# Patient Record
Sex: Male | Born: 1945 | Race: White | Hispanic: No | Marital: Married | Smoking: Never smoker
Health system: Southern US, Community
[De-identification: ages and names within clinical notes are randomized; demographics above are authoritative.]

## PROBLEM LIST (undated history)

## (undated) DIAGNOSIS — C61 Malignant neoplasm of prostate: Secondary | ICD-10-CM

## (undated) DIAGNOSIS — E785 Hyperlipidemia, unspecified: Secondary | ICD-10-CM

## (undated) DIAGNOSIS — I4891 Unspecified atrial fibrillation: Secondary | ICD-10-CM

## (undated) HISTORY — PX: OTHER SURGICAL HISTORY: SHX169

## (undated) HISTORY — DX: Unspecified atrial fibrillation: I48.91

## (undated) HISTORY — PX: CYST EXCISION: SHX5701

## (undated) HISTORY — PX: TONSILECTOMY, ADENOIDECTOMY, BILATERAL MYRINGOTOMY AND TUBES: SHX2538

---

## 1997-10-30 ENCOUNTER — Emergency Department (HOSPITAL_COMMUNITY): Admission: EM | Admit: 1997-10-30 | Discharge: 1997-10-30 | Payer: Self-pay | Admitting: Emergency Medicine

## 2002-05-02 ENCOUNTER — Encounter: Payer: Self-pay | Admitting: Internal Medicine

## 2008-04-28 ENCOUNTER — Encounter: Admission: RE | Admit: 2008-04-28 | Discharge: 2008-04-28 | Payer: Self-pay | Admitting: Cardiology

## 2008-04-28 ENCOUNTER — Encounter: Payer: Self-pay | Admitting: Cardiology

## 2008-04-30 ENCOUNTER — Encounter: Admission: RE | Admit: 2008-04-30 | Discharge: 2008-04-30 | Payer: Self-pay | Admitting: Cardiology

## 2008-05-06 ENCOUNTER — Encounter (INDEPENDENT_AMBULATORY_CARE_PROVIDER_SITE_OTHER): Payer: Self-pay | Admitting: Cardiology

## 2008-05-06 ENCOUNTER — Ambulatory Visit (HOSPITAL_COMMUNITY): Admission: RE | Admit: 2008-05-06 | Discharge: 2008-05-06 | Payer: Self-pay | Admitting: Cardiology

## 2009-08-30 ENCOUNTER — Encounter (INDEPENDENT_AMBULATORY_CARE_PROVIDER_SITE_OTHER): Payer: Self-pay | Admitting: *Deleted

## 2009-09-09 ENCOUNTER — Encounter (INDEPENDENT_AMBULATORY_CARE_PROVIDER_SITE_OTHER): Payer: Self-pay | Admitting: *Deleted

## 2009-09-13 ENCOUNTER — Ambulatory Visit: Payer: Self-pay | Admitting: Internal Medicine

## 2009-10-05 ENCOUNTER — Ambulatory Visit: Payer: Self-pay | Admitting: Internal Medicine

## 2009-10-05 ENCOUNTER — Ambulatory Visit (HOSPITAL_COMMUNITY): Admission: RE | Admit: 2009-10-05 | Discharge: 2009-10-05 | Payer: Self-pay | Admitting: Internal Medicine

## 2009-10-07 ENCOUNTER — Encounter: Payer: Self-pay | Admitting: Internal Medicine

## 2010-06-30 NOTE — Procedures (Signed)
Summary: Colonoscopy  Patient: [name] Note: All result statuses are Final unless otherwise noted.  Tests: (1) Colonoscopy (COL)   COL Colonoscopy           DONE     [facility]     [address] [place], [place]  [address]           COLONOSCOPY PROCEDURE REPORT           PATIENT:  Stephen Francis, Stephen Francis  MR#:  [id number]     BIRTHDATE:  1946/03/06, 63 yrs. old  GENDER:  male     ENDOSCOPIST:  Iva Boop, MD, [facility]           PROCEDURE DATE:  10/05/2009     PROCEDURE:  Colonoscopy with snare polypectomy     ASA CLASS:  Class III     INDICATIONS:  surveillance and high-risk screening, history of     polyps Two polyps removed 2003.     MEDICATIONS:   Fentanyl 100 mcg IV, Versed 7.5 mg IV           DESCRIPTION OF PROCEDURE:   After the risks benefits and     alternatives of the procedure were thoroughly explained, informed     consent was obtained.  Digital rectal exam was performed and     revealed no abnormalities and normal prostate.   The  endoscope     was introduced through the anus and advanced to the cecum, which     was identified by both the appendix and ileocecal valve, without     limitations.  The quality of the prep was good, using MoviPrep.     The instrument was then slowly withdrawn as the colon was fully     examined. Insertion: 6 mins withdrawal: 12 mins     <<PROCEDUREIMAGES>>           FINDINGS:  A sessile polyp was found in the proximal transverse     colon. It was 12 mm in size. Polyp was snared, then cauterized     with monopolar cautery. Retrieval was successful. Removed in two     pieces. Difficult snare due to location on edge of mobile fold.     Moderate diverticulosis was found in the left colon.  This was     otherwise a normal examination of the colon.   Retroflexed views     in the rectum revealed no abnormalities.    The scope was then     withdrawn from the patient and the procedure completed.           COMPLICATIONS:  None     ENDOSCOPIC IMPRESSION:     1) 12 mm sessile polyp in the proximal transverse colon -     removed     2) Moderate diverticulosis in the left colon     3) Otherwise normal examination, good prep     4) Personal history of colon polyp removal 2003 - pathology not     available now but will review     RECOMMENDATIONS:     1) No aspirin or NSAID's for 2 weeks     REPEAT EXAM:  In for Colonoscopy, pending biopsy results.           Iva Boop, MD, [facility]           CC:  Lesle Chris, MD and The Patient  n.     eSIGNED:   Iva Boop at 10/05/2009 09:31 AM           [name], [id number]  Note: An exclamation mark (!) indicates a result that was not dispersed into the flowsheet. Document Creation Date: 10/05/2009 9:32 AM _______________________________________________________________________  (1) Order result status: Final Collection or observation date-time: 10/05/2009 09:24 Requested date-time:  Receipt date-time:  Reported date-time:  Referring Physician:   Ordering Physician: Stan Head [phone]) Specimen Source:  Source: Launa Grill Order Number: [id number] Lab site:

## 2010-06-30 NOTE — Procedures (Signed)
Summary: Colonoscopy   Colonoscopy  Procedure date:  05/02/2002  Findings:      Location:  [facility].   Patient Name: Stephen Francis, Stephen Francis MRN:  Procedure Procedures: Colonoscopy CPT: 91478.  Personnel: Endoscopist: Iva Boop, MD, [facility].  Exam Location: Exam performed in Outpatient Clinic. Outpatient  Patient Consent: Procedure, Alternatives, Risks and Benefits discussed, consent obtained, from patient. Consent was obtained by the RN.  Indications  Average Risk Screening Routine.  History  Pre-Exam Physical: Performed May 02, 2002. Cardio-pulmonary exam, Rectal exam, HEENT exam , Mental status exam WNL.  Exam Exam: Extent of exam reached: Cecum, extent intended: Cecum.  The cecum was identified by appendiceal orifice and IC valve. Patient position: left side to back. Colon retroflexion performed. Images taken. ASA Classification: I. Tolerance: excellent.  Monitoring: Pulse and BP monitoring, Oximetry used. Supplemental O2 given.  Colon Prep Used Golytely for colon prep. Prep results: good.  Sedation Meds: Patient assessed and found to be appropriate for moderate (conscious) sedation. Fentanyl 100 mcg. given IV. Versed 8 mg. given IV.  Findings POLYP: Transverse Colon, Maximum size: 4 mm. diminutive, sessile polyp. Procedure:  biopsy without cautery, removed, retrieved, Polyp sent to pathology. 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  MULTIPLE POLYPS: Ascending Colon. minimum size 4 mm, maximum size 8 mm. Procedure:  snare with cautery, removed, retrieved, 2 polyps Polyps sent to pathology. ICD9: Neoplasia, Benign, Large Bowel: 211. 3. Comments: Smaller polyp removed with biopsy forceps.  NORMAL EXAM: Cecum.  - DIVERTICULOSIS: Sigmoid Colon. Not bleeding. ICD9: Diverticulosis, Colon: 562.10. Comments: Moderate.    Comments: All other areas not decsribed above are normal. Assessment Abnormal examination, see findings above.   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  562.10: Diverticulosis, Colon.   Events  Unplanned Interventions: No intervention was required.  Plans Patient Education: Patient given standard instructions for: Polyps. Diverticulosis.  Disposition: After procedure patient sent to recovery. After recovery patient sent home.  Scheduling/Referral: Colonoscopy, to Iva Boop, MD, [facility], 3 years, around May 02, 2005.  Primary Care Provider, to [facility] A. Cleta Alberts, MD, as planned/scheduled,    This report was created from the original endoscopy report, which was reviewed and signed by the above listed endoscopist.

## 2010-06-30 NOTE — Letter (Signed)
Summary: Patient Notice- Polyp Results  [facility]  [address] [place], [place] [address]   Phone: [phone]  Fax: [phone]        Oct 07, 2009 MRN: [id number]    LEELAND LOVELADY [address] [place], [place]  [address]    Dear Mr. FARNAN,  The polyp removed from your colon was adenomatous. This means that it was pre-cancerous or that  it had the potential to change into cancer over time.   I recommend that you have a repeat colonoscopy in 3 years to determine if you have developed any new polyps over time. this is based upon current, nationally recognized expert guidelines. If you develop any new rectal bleeding, abdominal pain or significant bowel habit changes, please contact us before then.  Please call us if you are having persistent problems or have questions about your condition that have not been fully answered at this time.  Sincerely,  Iva Boop MD, [facility]  This letter has been electronically signed by your physician.  Appended Document: Patient Notice- Polyp Results letter mailed to patient's home

## 2010-06-30 NOTE — Procedures (Signed)
Summary: Instruction for procedure/[facility] (out pt)  Instruction for procedure/[facility] (out pt)   Imported By: Sherian Rein 09/15/2009 15:09:22  _____________________________________________________________________  External Attachment:    Type:   Image     Comment:   External Document

## 2010-06-30 NOTE — Miscellaneous (Signed)
Summary: [facility]  Clinical Lists Changes  Medications: Added new medication of MOVIPREP 100 GM  SOLR (PEG-KCL-NACL-NASULF-NA ASC-C) As per prep instructions. - Signed Rx of MOVIPREP 100 GM  SOLR (PEG-KCL-NACL-NASULF-NA ASC-C) As per prep instructions.;  #1 x 0;  Signed;  Entered by: Sherren Kerns RN;  Authorized by: Iva Boop MD, FACG;  Method used: Electronically to [id number] [address], [place], [place]  [address], Ph: [phone] or [phone], Fax: [phone] Observations: Added new observation of ALLERGY REV: Done (09/13/2009 10:24) Added new observation of NKA: T (09/13/2009 10:24)    Prescriptions: MOVIPREP 100 GM  SOLR (PEG-KCL-NACL-NASULF-NA ASC-C) As per prep instructions.  #1 x 0   Entered by:   Sherren Kerns RN   Authorized by:   Iva Boop MD, [facility]   Signed by:   Sherren Kerns RN on 09/13/2009   Method used:   Electronically to        [id number] (retail)       [address]       [place], [place]  [address]       Ph: [phone] or [phone]       Fax: [phone]   RxID:   [id number]  patient wt 371.0 lbs, rescheduled patient at [facility] per policy. Rmyers,Rn

## 2010-06-30 NOTE — Letter (Signed)
Summary: [facility] Instructions  [facility]  [address] [place], [place] [address]   Phone: [phone]  Fax: [phone]       Stephen Francis    [birth year]    MRN: [id number]        Procedure Day /Date:  Tuesday 10/05/09     Arrival Time:  7:30 a.m.     Procedure Time:  8:30 a.m.     Location of Procedure:                    [facility] Registration                       PREPARATION FOR COLONOSCOPY WITH MOVIPREP   Starting 5 days prior to your procedure 09/30/09 do not eat nuts, seeds, popcorn, corn, beans, peas,  salads, or any raw vegetables.  Do not take any fiber supplements (e.g. Metamucil, Citrucel, and Benefiber).  THE DAY BEFORE YOUR PROCEDURE         DATE: 10/04/09 DAY: Monday  1.  Drink clear liquids the entire day-NO SOLID FOOD  2.  Do not drink anything colored red or purple.  Avoid juices with pulp.  No orange juice.  3.  Drink at least 64 oz. (8 glasses) of fluid/clear liquids during the day to prevent dehydration and help the prep work efficiently.  CLEAR LIQUIDS INCLUDE: Water Jello Ice Popsicles Tea (sugar ok, no milk/cream) Powdered fruit flavored drinks Coffee (sugar ok, no milk/cream) Gatorade Juice: apple, white grape, white cranberry  Lemonade Clear bullion, consomm, broth Carbonated beverages (any kind) Strained chicken noodle soup Hard Candy                             4.  In the morning, mix first dose of MoviPrep solution:    Empty 1 Pouch A and 1 Pouch B into the disposable container    Add lukewarm drinking water to the top line of the container. Mix to dissolve    Refrigerate (mixed solution should be used within 24 hrs)  5.  Begin drinking the prep at 5:00 p.m. The MoviPrep container is divided by 4 marks.   Every 15 minutes drink the solution down to the next mark (approximately 8 oz) until the full liter is complete.   6.  Follow completed prep with 16 oz of clear liquid of your choice (Nothing red  or purple).  Continue to drink clear liquids until bedtime.  7.  Before going to bed, mix second dose of MoviPrep solution:    Empty 1 Pouch A and 1 Pouch B into the disposable container    Add lukewarm drinking water to the top line of the container. Mix to dissolve    Refrigerate  THE DAY OF YOUR PROCEDURE      DATE: 10/05/09  DAY: Tuesday  Beginning at 3:30 AM (5 hours before procedure):         1. Every 15 minutes, drink the solution down to the next mark (approx 8 oz) until the full liter is complete.  2. Follow completed prep with 16 oz. of clear liquid of your choice.    3. You may drink clear liquids until 4:30 AM (4 HOURS BEFORE PROCEDURE).   MEDICATION INSTRUCTIONS  Unless otherwise instructed, you should take regular prescription medications with a small sip of water   as early as possible the morning of your procedure.  Additional medication instructions: n/a         OTHER INSTRUCTIONS  You will need a responsible adult at least 64 years of age to accompany you and drive you home.   This person must remain in the waiting room during your procedure.  Wear loose fitting clothing that is easily removed.  Leave jewelry and other valuables at home.  However, you may wish to bring a book to read or  an iPod/MP3 player to listen to music as you wait for your procedure to start.  Remove all body piercing jewelry and leave at home.  Total time from sign-in until discharge is approximately 2-3 hours.  You should go home directly after your procedure and rest.  You can resume normal activities the  day after your procedure.  The day of your procedure you should not:   Drive   Make legal decisions   Operate machinery   Drink alcohol   Return to work  You will receive specific instructions about eating, activities and medications before you leave.    The above instructions have been reviewed and explained to me by   Sherren Kerns RN  September 13, 2009  10:45 AM    I fully understand and can verbalize these instructions _____________________________ Date _________

## 2010-06-30 NOTE — Letter (Signed)
Summary: Previsit letter  [facility]  [address] [place], [place] [address]   Phone: [phone]  Fax: [phone]       08/30/2009 MRN: [id number]  Stephen Francis [address] [place], [place]  [address]  Dear [name],  Welcome to the Gastroenterology Division at [facility].    You are scheduled to see a nurse for your pre-procedure visit on 09/13/2009 at 10:30AM on the 3rd floor at [facility], [address]  We ask that you try to arrive at our office 15 minutes prior to your appointment time to allow for check-in.  Your nurse visit will consist of discussing your medical and surgical history, your immediate family medical history, and your medications.    Please bring a complete list of all your medications or, if you prefer, bring the medication bottles and we will list them.  We will need to be aware of both prescribed and over the counter drugs.  We will need to know exact dosage information as well.  If you are on blood thinners (Coumadin, Plavix, Aggrenox, Ticlid, etc.) please call our office today/prior to your appointment, as we need to consult with your physician about holding your medication.   Please be prepared to read and sign documents such as consent forms, a financial agreement, and acknowledgement forms.  If necessary, and with your consent, a friend or relative is welcome to sit-in on the nurse visit with you.  Please bring your insurance card so that we may make a copy of it.  If your insurance requires a referral to see a specialist, please bring your referral form from your primary care physician.  No co-pay is required for this nurse visit.     If you cannot keep your appointment, please call [phone] to cancel or reschedule prior to your appointment date.  This allows [place] the opportunity to schedule an appointment for another patient in need of care.    Thank you for choosing [facility] for your medical  needs.  We appreciate the opportunity to care for you.  Please visit [place] at our website  to learn more about our practice.                     Sincerely.                                                                                                                   The Gastroenterology Division

## 2011-10-10 ENCOUNTER — Ambulatory Visit: Payer: 59

## 2011-10-10 ENCOUNTER — Ambulatory Visit (INDEPENDENT_AMBULATORY_CARE_PROVIDER_SITE_OTHER): Payer: 59 | Admitting: Emergency Medicine

## 2011-10-10 ENCOUNTER — Encounter: Payer: Self-pay | Admitting: Emergency Medicine

## 2011-10-10 VITALS — BP 141/83 | HR 64 | Temp 98.1°F | Resp 20 | Ht 74.0 in | Wt 379.2 lb

## 2011-10-10 DIAGNOSIS — L719 Rosacea, unspecified: Secondary | ICD-10-CM

## 2011-10-10 DIAGNOSIS — E782 Mixed hyperlipidemia: Secondary | ICD-10-CM

## 2011-10-10 DIAGNOSIS — I4891 Unspecified atrial fibrillation: Secondary | ICD-10-CM

## 2011-10-10 DIAGNOSIS — M25519 Pain in unspecified shoulder: Secondary | ICD-10-CM

## 2011-10-10 DIAGNOSIS — M1990 Unspecified osteoarthritis, unspecified site: Secondary | ICD-10-CM

## 2011-10-10 DIAGNOSIS — R739 Hyperglycemia, unspecified: Secondary | ICD-10-CM

## 2011-10-10 DIAGNOSIS — Z0000 Encounter for general adult medical examination without abnormal findings: Secondary | ICD-10-CM

## 2011-10-10 DIAGNOSIS — IMO0002 Reserved for concepts with insufficient information to code with codable children: Secondary | ICD-10-CM

## 2011-10-10 LAB — CBC WITH DIFFERENTIAL/PLATELET
Basophils Absolute: 0.1 10*3/uL (ref 0.0–0.1)
Basophils Relative: 1 % (ref 0–1)
Eosinophils Absolute: 0.2 10*3/uL (ref 0.0–0.7)
Eosinophils Relative: 3 % (ref 0–5)
HCT: 48.6 % (ref 39.0–52.0)
MCHC: 32.1 g/dL (ref 30.0–36.0)
MCV: 91.2 fL (ref 78.0–100.0)
Monocytes Absolute: 0.5 10*3/uL (ref 0.1–1.0)
Platelets: 284 10*3/uL (ref 150–400)
RDW: 13.7 % (ref 11.5–15.5)
WBC: 7.5 10*3/uL (ref 4.0–10.5)

## 2011-10-10 LAB — POCT URINALYSIS DIPSTICK
Protein, UA: 30
Spec Grav, UA: >=1.03
Urobilinogen, UA: 0.2
pH, UA: 5.5

## 2011-10-10 LAB — COMPREHENSIVE METABOLIC PANEL
AST: 28 U/L (ref 0–37)
Albumin: 4.3 g/dL (ref 3.5–5.2)
Alkaline Phosphatase: 70 U/L (ref 39–117)
BUN: 9 mg/dL (ref 6–23)
Creat: 0.93 mg/dL (ref 0.50–1.35)
Glucose, Bld: 113 mg/dL — ABNORMAL HIGH (ref 70–99)
Total Bilirubin: 1.1 mg/dL (ref 0.3–1.2)

## 2011-10-10 LAB — LIPID PANEL
Cholesterol: 175 mg/dL (ref 0–200)
HDL: 22 mg/dL — ABNORMAL LOW (ref >39)
Total CHOL/HDL Ratio: 8 Ratio
Triglycerides: 211 mg/dL — ABNORMAL HIGH (ref <150)
VLDL: 42 mg/dL — ABNORMAL HIGH (ref 0–40)

## 2011-10-10 LAB — POCT UA - MICROSCOPIC ONLY: Crystals, Ur, HPF, POC: NEGATIVE

## 2011-10-10 MED ORDER — NIACIN ER (ANTIHYPERLIPIDEMIC) 500 MG PO TBCR: 500.0000 mg | EXTENDED_RELEASE_TABLET | ORAL | Status: DC

## 2011-10-10 MED ORDER — METRONIDAZOLE 1 % EX GEL: Freq: Every day | CUTANEOUS | Status: AC

## 2011-10-10 MED ORDER — METOPROLOL TARTRATE 50 MG PO TABS: 50.0000 mg | ORAL_TABLET | Freq: Two times a day (BID) | ORAL | Status: DC

## 2011-10-10 NOTE — Progress Notes (Signed)
Subjective:    Patient ID: Stephen Francis, male    DOB: 1945-07-21, 66 y.o.   MRN: [id number]  HPI patient enters for general physical exam. His weight continues to be the major problem. He's had some mild discomfort in his left shoulder.    Review of Systems  Constitutional:       He continues to battle with his weight. He is unable to lose weight.  HENT:       He's been to the dermatologist [place] and treated with doxycycline without improvement. 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  Eyes: Negative.   Respiratory: Negative.   Cardiovascular:       He has a history of atrial fib has been to a cardiologist in March everything stable at present.  Gastrointestinal: Negative.   Genitourinary:       He intermittently has a moist drainage in his right groin area. He is using antifungal creams on this in the past  Musculoskeletal:       He continues to have a lot of discomfort in his knees his right knee is worse than his left. He also has developed pain in his left shoulder.  Neurological: Negative.   Hematological: Negative.   Psychiatric/Behavioral: Negative.        Objective:   Physical Exam  Constitutional: He appears well-developed and well-nourished.  HENT:  Head: Normocephalic.  Right Ear: External ear normal.  Left Ear: External ear normal.  Eyes: Pupils are equal, round, and reactive to light.  Neck: Normal range of motion. No thyromegaly present.  Cardiovascular: Normal rate, regular rhythm and normal heart sounds.  Exam reveals no gallop and no friction rub.   No murmur heard. Pulmonary/Chest: Effort normal and breath sounds normal. He has no wheezes. He has no rales.  Abdominal:       Abdominal exam reveals reducible ventral hernia and umbilical hernia.  Genitourinary:       There is redness in the intertriginous areas bilaterally. There are no hernias.  Musculoskeletal:       He has significant degenerative changes of both  knees worse on the right knee. He has very limited motion in the left shoulder lacks ability to abduct the left shoulder past 90 degrees  Neurological: He is alert. No cranial nerve deficit. Coordination normal.  Skin:       He has a significant rhinophyma. His redness and dis coloration in the malar areas bilaterally   Results for orders placed in visit on 10/10/11  POCT UA - MICROSCOPIC ONLY      Component Value Range   WBC, Ur, HPF, POC 0-4     RBC, urine, microscopic 0-2     Bacteria, U Microscopic trace     Mucus, UA large     Epithelial cells, urine per micros 0-3     Crystals, Ur, HPF, POC neg     Casts, Ur, LPF, POC 0-3     Yeast, UA neg    POCT URINALYSIS DIPSTICK      Component Value Range   Color, UA yellwo     Clarity, UA clear     Glucose, UA neg     Bilirubin, UA small     Ketones, UA trace     Spec Grav, UA >=1.030     Blood, UA neg     pH, UA 5.5     Protein, UA 30     Urobilinogen, UA 0.2  Nitrite, UA neg     Leukocytes, UA Negative    IFOBT (OCCULT BLOOD)      Component Value Range   IFOBT Negative      [facility] reading (PRIMARY) by  Dr. Cleta Alberts x-ray the left shoulder shows arthritic changes but no acute abnormalities .      Assessment & Plan:  Right leg continues to be the main issue. His atrial fibrillation has not been an issue. His large ventral hernias that I would be hesitant to repair. He also has significant orthopedic issues involving his knees and now his left shoulder. We'll try topical treatment for his acne

## 2011-10-10 NOTE — Patient Instructions (Signed)
Use Lotrimin AF  fungal spray powder to the groin area as needed when he developed the moisture problem. Start the MetroGel facial medication and see if this helps with your rosacea. An appointment has been made with an orthopedist for evaluation of his shoulder and knees the

## 2011-10-12 ENCOUNTER — Telehealth: Payer: Self-pay

## 2011-10-12 NOTE — Telephone Encounter (Signed)
.  [facility] The patient called requesting review of lab work that he was called about yesterday 10/11/11.  The patient states he was called yesterday but doesn't feel that he retained the information properly and would like a return call to explain his labs.  Please call patient at [phone].

## 2011-10-13 NOTE — Telephone Encounter (Signed)
Pt called again regarding previous message and to add that he received an email from his pharmacy about a medication he doesn't take any more - confused about that.  Best: [phone]  bf

## 2011-10-14 NOTE — Telephone Encounter (Signed)
GAVE PT HIS LAB RESULTS. HE UNDERSTOOD. CLEARED UP HIS QUESTIONS ABOUT HIS RX.

## 2012-04-09 ENCOUNTER — Encounter: Payer: Self-pay | Admitting: Emergency Medicine

## 2012-04-09 ENCOUNTER — Ambulatory Visit (INDEPENDENT_AMBULATORY_CARE_PROVIDER_SITE_OTHER): Payer: Medicare Other | Admitting: Emergency Medicine

## 2012-04-09 ENCOUNTER — Telehealth: Payer: Self-pay

## 2012-04-09 VITALS — BP 148/80 | HR 89 | Temp 98.8°F | Resp 16 | Ht 74.0 in | Wt 384.8 lb

## 2012-04-09 DIAGNOSIS — I4891 Unspecified atrial fibrillation: Secondary | ICD-10-CM

## 2012-04-09 DIAGNOSIS — N200 Calculus of kidney: Secondary | ICD-10-CM

## 2012-04-09 DIAGNOSIS — Z23 Encounter for immunization: Secondary | ICD-10-CM

## 2012-04-09 DIAGNOSIS — R972 Elevated prostate specific antigen [PSA]: Secondary | ICD-10-CM

## 2012-04-09 DIAGNOSIS — R739 Hyperglycemia, unspecified: Secondary | ICD-10-CM

## 2012-04-09 DIAGNOSIS — E669 Obesity, unspecified: Secondary | ICD-10-CM

## 2012-04-09 DIAGNOSIS — I480 Paroxysmal atrial fibrillation: Secondary | ICD-10-CM | POA: Insufficient documentation

## 2012-04-09 DIAGNOSIS — L719 Rosacea, unspecified: Secondary | ICD-10-CM | POA: Insufficient documentation

## 2012-04-09 DIAGNOSIS — R7309 Other abnormal glucose: Secondary | ICD-10-CM

## 2012-04-09 LAB — GLUCOSE, POCT (MANUAL RESULT ENTRY): POC Glucose: 120 mg/dl — AB (ref 70–99)

## 2012-04-09 NOTE — Telephone Encounter (Signed)
Pt seen in office today 04-08-12 by Dr. Cleta Alberts and is needing rx refill on niaspan this rx has doubled in price and pt cant afford although [facility] [address] [facility] a generic brand that's cheaper and pt would like to see if Dr will send rx [facility] to [facility]. [phone]

## 2012-04-09 NOTE — Progress Notes (Signed)
  Subjective:    Patient ID: Stephen Francis, male    DOB: 24-Dec-1945, 66 y.o.   MRN: [id number]  HPI patient enters for recheck. He was found to have a mildly elevated PSA rising PSA and was advised to come in in 6 months rather than a year. He has no prostate symptoms. He has a history of atrial fibrillation and is followed by the cardiologist. He is a history of a having his last colonoscopy in 2009 with the finding of a polyp. He denies chest pain shortness of breath bowel problems extremity problems. His sugars have been borderline elevated but hemoglobin A1c's have been in range    Review of Systems     Objective:   Physical Exam HEENT exam is unremarkable. Neck is supple. Chest is clear to auscultation and percussion. Cardiac exam has a regular rate without murmurs. Abdomen is obese. Prostate is normal size  Results for orders placed in visit on 04/09/12  GLUCOSE, POCT (MANUAL RESULT ENTRY)      Component Value Range   POC Glucose 120 (*) 70 - 99 mg/dl  POCT GLYCOSYLATED HEMOGLOBIN (HGB A1C)      Component Value Range   Hemoglobin A1C 5.8          Assessment & Plan:  We'll check hemoglobin A1c and sugar today. We'll also check a PSA if he continues to be on a rapid  rate of incline will go ahead and make urological referral.

## 2012-04-10 NOTE — Telephone Encounter (Signed)
Sorry. Trilipix 135mg  # 30 refill 1 year

## 2012-04-10 NOTE — Telephone Encounter (Signed)
Please clarify medication ? Trilipix ? 135mg  dose? Please advise.

## 2012-04-10 NOTE — Telephone Encounter (Signed)
Okay to change to Trileptal 635 mg 1 daily #30 refills x1 year recheck fasting lipid panel in 6 months with LFTs and CPK .

## 2012-04-10 NOTE — Telephone Encounter (Signed)
Dr. Cleta Alberts,  Is fenofibrate ok with you?

## 2012-04-10 NOTE — Telephone Encounter (Signed)
Please advise on alternative to Niaspan, which may be more cost effective.

## 2012-04-11 MED ORDER — CHOLINE FENOFIBRATE 135 MG PO CPDR: 135.0000 mg | DELAYED_RELEASE_CAPSULE | Freq: Every day | ORAL | Status: DC

## 2012-04-11 NOTE — Telephone Encounter (Signed)
Thanks, sent this in for him he is advised.

## 2012-09-22 ENCOUNTER — Other Ambulatory Visit: Payer: Self-pay | Admitting: Emergency Medicine

## 2012-10-08 ENCOUNTER — Ambulatory Visit (INDEPENDENT_AMBULATORY_CARE_PROVIDER_SITE_OTHER): Payer: Medicare Other | Admitting: Emergency Medicine

## 2012-10-08 ENCOUNTER — Encounter: Payer: Self-pay | Admitting: Emergency Medicine

## 2012-10-08 VITALS — BP 160/82 | HR 94 | Temp 98.6°F | Resp 20 | Ht 73.0 in | Wt 389.0 lb

## 2012-10-08 DIAGNOSIS — B379 Candidiasis, unspecified: Secondary | ICD-10-CM

## 2012-10-08 DIAGNOSIS — B49 Unspecified mycosis: Secondary | ICD-10-CM

## 2012-10-08 DIAGNOSIS — R739 Hyperglycemia, unspecified: Secondary | ICD-10-CM

## 2012-10-08 DIAGNOSIS — R7309 Other abnormal glucose: Secondary | ICD-10-CM

## 2012-10-08 DIAGNOSIS — E669 Obesity, unspecified: Secondary | ICD-10-CM

## 2012-10-08 DIAGNOSIS — R972 Elevated prostate specific antigen [PSA]: Secondary | ICD-10-CM

## 2012-10-08 LAB — POCT GLYCOSYLATED HEMOGLOBIN (HGB A1C): Hemoglobin A1C: 5.6

## 2012-10-08 LAB — PSA, MEDICARE: PSA: 2.54 ng/mL (ref <=4.00)

## 2012-10-08 LAB — LIPID PANEL
Cholesterol: 169 mg/dL (ref 0–200)
HDL: 23 mg/dL — ABNORMAL LOW (ref >39)

## 2012-10-08 LAB — GLUCOSE, POCT (MANUAL RESULT ENTRY): POC Glucose: 125 mg/dl — AB (ref 70–99)

## 2012-10-08 MED ORDER — CLOTRIMAZOLE-BETAMETHASONE 1-0.05 % EX CREA: TOPICAL_CREAM | Freq: Two times a day (BID) | CUTANEOUS | Status: DC

## 2012-10-08 NOTE — Progress Notes (Signed)
  Subjective:    Patient ID: EDON HOADLEY, male    DOB: 1946/01/29, 67 y.o.   MRN: [id number]  HPI patient is to followup hyperglycemia and obesity. He has been active in ER. He is complaining today of her rash in his scrotal perineal area. He has a history of the yeast in this area. He has a history of atrial fibrillation in is seen yearly by cardiologist. He apparently has only had one episode of this. He also has a history of her rising PSA and we have been following this. He also has a rash in the front of his left arm.    Review of Systems     Objective:   Physical Exam patient is alert and cooperative in no distress. There is a red scaly rash anterior axillary area of the left arm. There is no rash in the axilla itself. Chest is clear. Heart rate no murmurs rubs or gallops. The area reveals redness with some weeping.  Results for orders placed in visit on 10/08/12  GLUCOSE, POCT (MANUAL RESULT ENTRY)      Result Value Range   POC Glucose 125 (*) 70 - 99 mg/dl  POCT GLYCOSYLATED HEMOGLOBIN (HGB A1C)      Result Value Range   Hemoglobin A1C 5.6          Assessment & Plan:  No change in medications at the present time. Sugar it is acceptable. I did advise the go ahead and follow up with a cardiologist. His checkup in 6 months will be for physical. PSA was done because of his history of her rising PSA he was given Lotrisone cream to use for the rash in his groin area which was sent to the pharmacy.

## 2012-10-18 ENCOUNTER — Encounter: Payer: Self-pay | Admitting: Cardiology

## 2012-10-18 DIAGNOSIS — E785 Hyperlipidemia, unspecified: Secondary | ICD-10-CM | POA: Insufficient documentation

## 2012-10-18 NOTE — Progress Notes (Signed)
Patient ID: Stephen Francis, male   DOB: 09/26/1945, 67 y.o.   MRN: [id number]   Stephen, Francis  Date of visit:  10/18/2012 DOB:  Apr 24, 1946    Age:  67 yrs. Medical record number:  [id number]     Account number:  [id number] Primary Care Provider: Lesle Chris A ____________________________ CURRENT DIAGNOSES  1. Arrhythmia-Atrial Fibrillation  2. 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 Diabetes Mellitus-NIDD  4. Hyperlipidemia ____________________________ ALLERGIES  NKDA ____________________________ MEDICATIONS  1. aspirin 81 mg tablet, effervescent, 1 p.o. daily  2. Multi-Day tablet, 1 p.o. daily  3. Fish Oil 1,000 mg Capsule, 1 p.o. daily  4. fenofibric acid (choline) 135 mg capsule,delayed release(DR/EC), 1 p.o. daily  5. Vitamin D3 2,000 unit tablet, 1 p.o. daily  6. Vitamin B-1 50 mg tablet, 1 p.o. daily  7. metoprolol tartrate 50 mg tablet, BID ____________________________ CHIEF COMPLAINTS  Followup of Arrhythmia-Atrial Fibrillation ____________________________ HISTORY OF PRESENT ILLNESS  Patient seen for cardiac followup. He remains morbidly obese but continues to have a good year since he was recently here. He has had no recurrence of atrial fibrillation. He continues to have significant back and knee pain. 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 ____________________________ PAST HISTORY  Past Medical Illnesses:  morbid obesity, glucose intolerance, nephrolithiasis, hyperlipidemia;  Cardiovascular Illnesses:  atrial fibrillation;  Infectious Diseases:  no previous history of significant infectious diseases;  Surgical Procedures:  tonsillectomy;  Trauma History:  no previous history of significant trauma;  Cardiology Procedures-Invasive:  no previous interventional or invasive cardiology procedures;  Cardiology Procedures-Noninvasive:  chest CT December 2009, echocardiogram December 2009;   LVEF of 60% documented via echocardiogram on 05/06/2008,   ____________________________ CARDIO-PULMONARY TEST DATES EKG Date:  10/18/2012;  Holter/Event Monitor Date: 05/12/2008;  Echocardiography Date: 05/06/2008;  Chest Xray Date: 04/28/2008;  CT Scan Date:  04/30/2008   ____________________________ SOCIAL HISTORY Alcohol Use:  does not use alcohol;  Smoking:  nonsmoker;  Diet:  regular diet;  Lifestyle:  married and 1 son;  Exercise:  no regular exercise;  Occupation:  repaired, Building services engineer;  Residence:  lives with wife;   ____________________________ REVIEW OF SYSTEMS General:  longstanding obesity  Integumentary:rosacea Eyes: denies diplopia, history of glaucoma or visual problems. Ears, Nose, Throat, Mouth:  partial hearing loss right ear Respiratory: denies dyspnea, cough, wheezing or hemoptysis. Cardiovascular:  please review HPI  Genitourinary-Male: no dysuria, urgency, frequency, or nocturia  Musculoskeletal:  arthritis of the right knee, arthritis of the left shoulder  ____________________________ PHYSICAL EXAMINATION VITAL SIGNS  Blood Pressure:  136/78 Sitting, Right arm, large cuff  , 140/80 Standing, Right arm and large cuff   Pulse:  96/min. Weight:  .00 lbs. Height:  72"BMI: 0  Constitutional:  pleasant white male in no acute distress, severely obese Skin:  rosacea face Head:  normocephalic, normal hair pattern, no masses or tenderness ENT:  ears, nose and throat reveal no gross abnormalities.  Dentition good. Neck:  supple, without massess. No JVD, thyromegaly or carotid bruits. Carotid upstroke normal. Chest:  normal symmetry, clear to auscultation and percussion. Cardiac:  regular rhythm, normal S1 and S2, no S3 or S4, no murmurs,clicks, or rub heard Peripheral Pulses:  the femoral,dorsalis pedis, and posterior tibial pulses are full and equal bilaterally with no bruits auscultated. Extremities & Back:  no deformities, clubbing, cyanosis, erythema or edema  observed. Normal muscle strength and tone. Neurological:  no gross motor or  sensory deficits noted, affect appropriate, oriented x3. ____________________________ MOST RECENT LIPID PANEL 04/28/08  CHOL TOTL 167 mg/dl, LDL 96 NM, HDL 26 mg/dl, TRIGLYCER 161 mg/dl, ALT 30 u/l, ALK PHOS 74 u/l, CHOL/HDL 6.4 (Calc), AST 21 u/l and VLDL 45 ____________________________ IMPRESSIONS/PLAN  1. Paroxysmal atrial fibrillation currently maintaining sinus rhythm 2. Severe morbid obesity with a weight in excess of what our scales can calculate 3. Hyperlipidemia  Recommendations:  He is clinically done relatively well but he is mainly limited with arthritis. No recurrence of atrial fibrillation and EKG today shows sinus rhythm. Recommended followup in one year. Call if problems. ____________________________ TODAYS ORDERS  1. 12 Lead EKG: Today  2. Return Visit: 1 year  3. 12 Lead EKG: 1 year                       ____________________________ Cardiology Physician:  Darden Palmer MD [facility]

## 2012-10-18 NOTE — Progress Notes (Signed)
Patient ID: Stephen Francis, male   DOB: 09-23-1945, 67 y.o.   MRN: [id number] Ragan, Reale  Date of visit:  04/28/2008 DOB:  21-Mar-1946    Age:  67 yrs. Medical record number:  [id number]     Account number:  [id number] Primary Care Provider: Lesle Chris A ____________________________ CURRENT DIAGNOSES  1. Arrhythmia-Atrial Fibrillation  2. Diabetes Mellitus-NIDD  3. Obesity-morbid (100') ____________________________ ALLERGIES  NKDA ____________________________ MEDICATIONS  1. Aspirin 81 mg, 1 p.o. daily  2. Metoprolol Tartrate 50 mg, BID  3. Multi-Day Multiple Vitamins, 1 p.o. daily  4. Minocycline Hydrochloride 100 mg, PRN  5. Vitamin B-50 Vitamin B Complex, 1 p.o. daily  6. Fish Oil -, 1 p.o. daily ____________________________ CHIEF COMPLAINTS evaluation of atrial fibrillation ____________________________ HISTORY OF PRESENT ILLNESS  This very nice 67 year old male is seen at the request of Dr. Cleta Alberts for evaluation of atrial fibrillation of undetermined etiology.  The patient was seen by me in 1991 for palpitations and had what sounds like premature beats at the time.  He has been obese all of his life but has gained weight over the past years since I have seen him.  He has recently noted some exercise intolerance, a feeling of dryness in his throat and some sweating with activity.  On a routine evaluation at Dr. Deforest Hoyles office today he was found to be in atrial fibrillation with rapid response.  He has no previous history of hypertension or diabetes although his sugar was found to be elevated today.  He has no PND, orthopnea, or edema.  He has had no abnormal weight loss and has had no chest pain suggestive of angina.  He does not have significant caffeine excess.  ____________________________ PAST HISTORY  Past Medical Illnesses:  morbid obesity, glucose intolerance;  Cardiovascular Illnesses:  atrial fibrillation;  Infectious Diseases:  no previous history of significant infectious  diseases;  Surgical Procedures:  tonsillectomy;  Trauma History:  no previous history of significant trauma;  NYHA Classification:  I;  Cardiology Procedures-Invasive:  no previous interventional or invasive cardiology procedures;  Cardiology Procedures-Noninvasive:  no previous non-invasive cardiovascular testing;   ____________________________ CARDIO-PULMONARY TEST DATES EKG Date:  04/28/2008 ____________________________ FAMILY HISTORY Father - age 33, died post operatively; Mother - age 68, died suddenly, had asthma; Brother 1 - died trauma; Sister 1 -  alive and well;  ____________________________ SOCIAL HISTORY Alcohol Use:  does not use alcohol;  Smoking:  nonsmoker;  Diet:  regular diet;  Lifestyle:  married and 1 son;  Exercise:  no regular exercise;  Occupation:  repaired, Building services engineer;  Residence:  lives with wife;   ____________________________ REVIEW OF SYSTEMS General:  longstanding obesity Integumentary:  rosacea Eyes:  denies diplopia, history of glaucoma or visual problems. Ears, Nose, Throat, Mouth:  partial hearing loss right ear Respiratory:  denies dyspnea, cough, wheezing or hemoptysis. Cardiovascular:  please review HPI Abdominal:  denies dyspepsia, GI bleeding, constipation, or diarrhea Genitourinary-Male:  no dysuria, urgency, frequency, nocturia, or impotence Musculoskeletal:  denies any history of arthritis, venous insufficiency, or muscle weakness. Neurological:  denies any history of headaches, stroke, TIA, or seizure disorder. Psychiatric:  denies any history of depression or change in cognitive functions. Hematological/Immunologic:  denies any food allergies, bleeding disorders. ____________________________ PHYSICAL EXAMINATION  VITAL SIGNS  Blood Pressure:   150/88 Sitting, Right arm, large cuff   148/88 Standing, Right arm, large cuff   Pulse:  64/min.  Weight:  381.00 lbs.  Height:  74"  BMI: 49  Constitutional:  pleasant white male in no  acute distress, severely obese Skin:  rosacea face Head:  normocephalic, normal hair pattern, no masses or tenderness Eyes:  EOMS Intact, PERRLA, C clear, Funduscopic exam not done. ENT:  ears, nose and throat reveal no gross abnormalities.  Dentition good. Neck:  supple, without massess. No JVD, thyromegaly or carotid bruits. Carotid upstroke normal. Chest:  normal symmetry, clear to auscultation and percussion. Cardiac:  rapid irregularly irregular rhythm, normal S1 and S2, no S3 or S4, no murmurs,clicks, or rub heard Abdomen:  abdomen soft,non-tender, no masses, no hepatospenomegaly, or aneurysm noted, umbilical hernia Peripheral Pulses:  the femoral,dorsalis pedis, and posterior tibial pulses are full and equal bilaterally with no bruits auscultated. Extremities & Back:  no deformities, clubbing, cyanosis, erythema or edema observed. Normal muscle strength and tone. Neurological:  no gross motor or sensory deficits noted, affect appropriate, oriented x3. ____________________________ IMPRESSIONS/PLAN  1.  Rapid atrial fibrillation of undetermined age of onset  2.  Morbid obesity  3.  Elevated blood pressure today without previous diagnosis of hypertension  4.  Glucose intolerance  Recommendations:  12 EKG shows atrial fibrillation with rapid ventricular response.  Evidently laboratory work including thyroid tests has been drawn earlier.  He was given patient education regarding atrial fibrillation.  For the time being he may be maintained on aspirin for anticoagulation and we will decide about warfarin at a later date since his [place] score is low.   I would like for him to have an echocardiogram and in the meantime started him on metoprolol 50 mg twice daily for rate control.  I will see him back following the echocardiogram and chest x-ray.  Thank you for asking me to see him with you. ____________________________ TODAYS ORDERS  1. 12 Lead EKG: Today  2. 2D, color flow, doppler: At  Patient Convenience  3. CHEST XRAY: Today  4. Return Visit: 1 week                       ____________________________ Cardiology Physician:  Darden Palmer MD [facility]

## 2012-11-11 ENCOUNTER — Encounter: Payer: Self-pay | Admitting: Internal Medicine

## 2013-04-08 ENCOUNTER — Ambulatory Visit (INDEPENDENT_AMBULATORY_CARE_PROVIDER_SITE_OTHER): Payer: Medicare Other | Admitting: Emergency Medicine

## 2013-04-08 ENCOUNTER — Encounter: Payer: Self-pay | Admitting: Emergency Medicine

## 2013-04-08 VITALS — BP 156/92 | HR 88 | Temp 98.7°F | Resp 18 | Ht 74.0 in | Wt 386.8 lb

## 2013-04-08 DIAGNOSIS — Z139 Encounter for screening, unspecified: Secondary | ICD-10-CM

## 2013-04-08 DIAGNOSIS — Z0000 Encounter for general adult medical examination without abnormal findings: Secondary | ICD-10-CM

## 2013-04-08 DIAGNOSIS — H9319 Tinnitus, unspecified ear: Secondary | ICD-10-CM

## 2013-04-08 DIAGNOSIS — E669 Obesity, unspecified: Secondary | ICD-10-CM

## 2013-04-08 DIAGNOSIS — N529 Male erectile dysfunction, unspecified: Secondary | ICD-10-CM

## 2013-04-08 DIAGNOSIS — Z23 Encounter for immunization: Secondary | ICD-10-CM

## 2013-04-08 DIAGNOSIS — R7309 Other abnormal glucose: Secondary | ICD-10-CM

## 2013-04-08 LAB — POCT URINALYSIS DIPSTICK
Blood, UA: NEGATIVE
Glucose, UA: NEGATIVE
Ketones, UA: NEGATIVE
Leukocytes, UA: NEGATIVE
Nitrite, UA: NEGATIVE

## 2013-04-08 LAB — COMPREHENSIVE METABOLIC PANEL
ALT: 38 U/L (ref 0–53)
AST: 29 U/L (ref 0–37)
Albumin: 4 g/dL (ref 3.5–5.2)
Calcium: 9.3 mg/dL (ref 8.4–10.5)
Chloride: 100 mEq/L (ref 96–112)
Potassium: 4.2 mEq/L (ref 3.5–5.3)

## 2013-04-08 LAB — LIPID PANEL
Total CHOL/HDL Ratio: 7 Ratio
VLDL: 42 mg/dL — ABNORMAL HIGH (ref 0–40)

## 2013-04-08 LAB — POCT GLYCOSYLATED HEMOGLOBIN (HGB A1C): Hemoglobin A1C: 5.5

## 2013-04-08 LAB — PSA, MEDICARE: PSA: 2.9 ng/mL (ref <=4.00)

## 2013-04-08 MED ORDER — SILDENAFIL CITRATE 100 MG PO TABS: 50.0000 mg | ORAL_TABLET | Freq: Every day | ORAL | Status: DC | PRN

## 2013-04-08 NOTE — Progress Notes (Signed)
@[email or web address] @  Patient ID: Stephen Francis MRN: [id number], DOB: 06-04-1945 67 y.o. Date of Encounter: 04/08/2013, 8:24 AM  Primary Physician: No primary provider on file.  Chief Complaint: Physical (CPE)  HPI: 67 y.o. y/o male with history noted below here for CPE.  Doing well. No issues/complaints.  Review of Systems: Consitutional: No fever, chills, fatigue, night sweats, lymphadenopathy, or weight changes. Eyes: No visual changes, eye redness, or discharge. He does see the eye doctor on a yearly basis to ENT/Mouth: Ears: No otalgia, hearing loss, discharge. Nose: No congestion, rhinorrhea, sinus pain, or epistaxis. Throat: No sore throat, post nasal drip, or teeth pain. He has regular checkups with his dentist he is having difficulty with pain at Cardiovascular: No CP, palpitations, diaphoresis, DOE, edema, orthopnea, PND he has a history of proximal atrial fibrillation and is under the care of the cardiologist and recently had an appointment with them Respiratory: No cough, hemoptysis, , or wheezing. He gets short of breath easily. Gastrointestinal: No anorexia, dysphagia, reflux, pain, nausea, vomiting, hematemesis, diarrhea, constipation, BRBPR, or melena he is up-to-date on his colonoscopy he has a large umbilical hernia that does not bother him.. Genitourinary: No dysuria, frequency, urgency, hematuria, incontinence, nocturia, decreased urinary stream, discharge,  or testicular pain/masses. It consists of a problem recently and he is interested in trying Viagra. Musculoskeletal: No decreased ROM, myalgias, stiffness, joint swelling, or weakness. Skin: No rash, erythema, lesion changes, pain, warmth, jaundice, or pruritis. Neurological: No headache, dizziness, syncope, seizures, tremors, memory loss, coordination problems, or paresthesias. Psychological: No anxiety, depression, hallucinations, SI/HI. Endocrine: No fatigue, polydipsia, polyphagia, polyuria, or known diabetes. All  other systems were reviewed and are otherwise negative.  No past medical history on file.   Past Surgical History  Procedure Laterality Date  . Tonsilectomy, adenoidectomy, bilateral myringotomy and tubes      Home Meds:  Prior to Admission medications   Medication Sig Start Date End Date Taking? Authorizing Provider  aspirin 81 MG tablet Take 81 mg by mouth daily.   Yes Historical Provider, MD  Cholecalciferol (VITAMIN D-3) 1000 UNITS CAPS Take 2,000 Units by mouth.   Yes Historical Provider, MD  fish oil-omega-3 fatty acids 1000 MG capsule Take 2 g by mouth daily.   Yes Historical Provider, MD  metoprolol (LOPRESSOR) 50 MG tablet Take 1 tablet (50 mg total) by mouth 2 (two) times daily. Needs office visit/CPE 09/22/12  Yes Nelva Nay, PA-C  Multiple Vitamin (MULTIVITAMIN) tablet Take 1 tablet by mouth daily.   Yes Historical Provider, MD  niacin 500 MG tablet Take 500 mg by mouth 2 (two) times daily with a meal.   Yes Historical Provider, MD  B Complex-Biotin-FA (VITAMIN B50 COMPLEX PO) Take by mouth.    Historical Provider, MD  Choline Fenofibrate (TRILIPIX) 135 MG capsule Take 1 capsule (135 mg total) by mouth daily. 04/11/12   Collene Gobble, MD  clotrimazole-betamethasone (LOTRISONE) cream Apply topically 2 (two) times daily. 10/08/12   Collene Gobble, MD  niacin (NIASPAN) 500 MG CR tablet Take 1 tablet (500 mg total) by mouth 1 day or 1 dose. 10/10/11 10/09/12  Collene Gobble, MD    Allergies: No Known Allergies  History   Social History  . Marital Status: Married    Spouse Name: N/A    Number of Children: N/A  . Years of Education: N/A   Occupational History  . Not on file.   Social History Main Topics  . Smoking status: Never Smoker   .  Smokeless tobacco: Not on file  . Alcohol Use: Yes     Comment: rarely  . Drug Use: No  . Sexual Activity: Not on file   Other Topics Concern  . Not on file   Social History Narrative  . No narrative on file    No family  history on file.  Physical Exam: Blood pressure 156/92, pulse 88, temperature 98.7 F (37.1 C), temperature source Oral, resp. rate 18, height 6\' 2"  (1.88 m), weight 386 lb 12.8 oz (175.451 kg), SpO2 97.00%.  The patient is morbidly obese HEENT: Normocephalic, atraumatic. Conjunctiva pink, sclera non-icteric. Pupils 2 mm constricting to 1 mm, round, regular, and equally reactive to light and accomodation. EOMI. Internal auditory canal clear. TMs with good cone of light and without pathology. Nasal mucosa pink. Nares are without discharge. No sinus tenderness. Oral mucosa pink. Dentition . Pharynx without exudate.   Neck: Supple. Trachea midline. No thyromegaly. Full ROM. No lymphadenopathy. Lungs: Clear to auscultation bilaterally without wheezes, rales, or rhonchi. Breathing is of normal effort and unlabored. Cardiovascular: RRR with S1 S2. No murmurs, rubs, or gallops appreciated. Distal pulses 2+ symmetrically. No carotid or abdominal bruits.  Abdomen: Soft, non-tender, non-distended with normoactive bowel sounds. No hepatosplenomegaly or masses. No rebound/guarding. No CVA tenderness. Without hernias.  Rectal: No external hemorrhoids or fissures. Rectal vault without masses.  Genitourinary:  circumcised male. No penile lesions. Testes descended bilaterally, and smooth without tenderness or masses.  Musculoskeletal: Full range of motion and 5/5 strength throughout. Without swelling, atrophy, tenderness, crepitus, or warmth. Extremities without clubbing, cyanosis, or edema. Calves supple. Skin: Warm and moist without erythema, ecchymosis, wounds, or rash. Neuro: A+Ox3. CN II-XII grossly intact. Moves all extremities spontaneously. Full sensation throughout. Normal gait. DTR 2+ throughout upper and lower extremities. Finger to nose intact. Psych:  Responds to questions appropriately with a normal affect.   Results for orders placed in visit on 04/08/13  POCT URINALYSIS DIPSTICK      Result  Value Range   Color, UA yellow     Clarity, UA clear     Glucose, UA neg     Bilirubin, UA small     Ketones, UA neg     Spec Grav, UA >=1.030     Blood, UA neg     pH, UA 6.0     Protein, UA 30     Urobilinogen, UA 0.2     Nitrite, UA neg     Leukocytes, UA Negative    POCT GLYCOSYLATED HEMOGLOBIN (HGB A1C)      Result Value Range   Hemoglobin A1C 5.5    IFOBT (OCCULT BLOOD)      Result Value Range   IFOBT Negative    GLUCOSE, POCT (MANUAL RESULT ENTRY)      Result Value Range   POC Glucose 121 (*) 70 - 99 mg/dl   Studies: CBC, CMET, Lipid, PSA, UA:   Assessment/Plan:  67 y.o. y/o seen for yearly physical. We'll check on of the status of his tetanus. He is morbidly obese and knows this is an issue. He has had recent orthopedic problems and is under the care of Dr. Ave Filter for this. He has regular cardiology appointments for his paroxysmal atrial fib which has been under control. He denies any symptoms of sleep apnea but will discuss this with his wife. I did give him a trial prescription of Viagra to see if that would help with his problem with impotence. -Referral made to ENT for evaluation  of tinnitus. He was also given the number for her Dr. Karlyn Agee to evaluate him for his rosacea  Signed, Earl Lites, MD 04/08/2013 8:24 AM

## 2013-04-08 NOTE — Progress Notes (Signed)
  Subjective:    Patient ID: Stephen Francis, male    DOB: 1946/04/21, 67 y.o.   MRN: [id number]  HPI    Review of Systems     Objective:   Physical Exam        Assessment & Plan:

## 2013-04-08 NOTE — Progress Notes (Deleted)
  Subjective:    Patient ID: Stephen Francis, male    DOB: 01/01/1946, 67 y.o.   MRN: [id number]  HPI    Review of Systems  Constitutional: Positive for activity change.  HENT: Negative.   Eyes: Negative.   Respiratory: Negative.   Cardiovascular: Negative.   Gastrointestinal: Negative.   Endocrine: Negative.   Genitourinary: Negative.   Musculoskeletal: Positive for arthralgias and myalgias.  Skin: Negative.   Allergic/Immunologic: Positive for environmental allergies.  Neurological: Negative.   Hematological: Negative.   Psychiatric/Behavioral: Negative.        Objective:   Physical Exam        Assessment & Plan:

## 2013-04-09 ENCOUNTER — Telehealth: Payer: Self-pay | Admitting: Radiology

## 2013-04-09 ENCOUNTER — Telehealth: Payer: Self-pay

## 2013-04-09 DIAGNOSIS — N529 Male erectile dysfunction, unspecified: Secondary | ICD-10-CM

## 2013-04-09 MED ORDER — SILDENAFIL CITRATE 100 MG PO TABS: 50.0000 mg | ORAL_TABLET | Freq: Every day | ORAL | Status: DC | PRN

## 2013-04-09 NOTE — Telephone Encounter (Signed)
This has already been [facility]ed to [facility].

## 2013-04-09 NOTE — Telephone Encounter (Signed)
Patient got an rx for viagra he needs that faxed to [facility] if possible so he can get a better price please call patient at [phone] with questions

## 2013-04-09 NOTE — Telephone Encounter (Signed)
Resent Viagra per patient request.

## 2013-06-25 ENCOUNTER — Encounter: Payer: Self-pay | Admitting: Internal Medicine

## 2013-07-11 ENCOUNTER — Telehealth: Payer: Self-pay

## 2013-07-11 NOTE — Telephone Encounter (Signed)
PA needed for Viagra. Called pt and he verified he has never tried any other meds for ED and he has not consulted a urologist. I completed form for [facility] and faxed.

## 2013-07-16 NOTE — Telephone Encounter (Signed)
PA approved through 07/11/16. Notified pt.

## 2013-10-07 ENCOUNTER — Ambulatory Visit: Payer: Medicare Other | Admitting: Emergency Medicine

## 2013-10-14 ENCOUNTER — Ambulatory Visit: Payer: Medicare Other | Admitting: Emergency Medicine

## 2013-10-23 ENCOUNTER — Encounter: Payer: Self-pay | Admitting: Emergency Medicine

## 2013-10-23 ENCOUNTER — Encounter (HOSPITAL_COMMUNITY): Payer: Self-pay | Admitting: Emergency Medicine

## 2013-10-23 ENCOUNTER — Ambulatory Visit (INDEPENDENT_AMBULATORY_CARE_PROVIDER_SITE_OTHER): Payer: Medicare Other | Admitting: Emergency Medicine

## 2013-10-23 ENCOUNTER — Emergency Department (HOSPITAL_COMMUNITY): Payer: Medicare Other

## 2013-10-23 ENCOUNTER — Observation Stay (HOSPITAL_COMMUNITY)
Admission: EM | Admit: 2013-10-23 | Discharge: 2013-10-24 | Disposition: A | Discharge status: Home / Self Care | Payer: Medicare Other | Attending: Cardiology | Admitting: Cardiology

## 2013-10-23 VITALS — BP 146/88 | HR 150 | Resp 22 | Wt 384.0 lb

## 2013-10-23 DIAGNOSIS — R0602 Shortness of breath: Secondary | ICD-10-CM

## 2013-10-23 DIAGNOSIS — R7309 Other abnormal glucose: Secondary | ICD-10-CM | POA: Insufficient documentation

## 2013-10-23 DIAGNOSIS — E669 Obesity, unspecified: Secondary | ICD-10-CM

## 2013-10-23 DIAGNOSIS — I10 Essential (primary) hypertension: Secondary | ICD-10-CM

## 2013-10-23 DIAGNOSIS — Z7982 Long term (current) use of aspirin: Secondary | ICD-10-CM | POA: Insufficient documentation

## 2013-10-23 DIAGNOSIS — I4891 Unspecified atrial fibrillation: Secondary | ICD-10-CM

## 2013-10-23 DIAGNOSIS — I480 Paroxysmal atrial fibrillation: Secondary | ICD-10-CM

## 2013-10-23 DIAGNOSIS — Z6841 Body Mass Index (BMI) 40.0 and over, adult: Secondary | ICD-10-CM | POA: Insufficient documentation

## 2013-10-23 DIAGNOSIS — R0989 Other specified symptoms and signs involving the circulatory and respiratory systems: Secondary | ICD-10-CM | POA: Insufficient documentation

## 2013-10-23 DIAGNOSIS — E785 Hyperlipidemia, unspecified: Secondary | ICD-10-CM | POA: Insufficient documentation

## 2013-10-23 DIAGNOSIS — Z7901 Long term (current) use of anticoagulants: Secondary | ICD-10-CM | POA: Insufficient documentation

## 2013-10-23 DIAGNOSIS — R0609 Other forms of dyspnea: Secondary | ICD-10-CM | POA: Insufficient documentation

## 2013-10-23 HISTORY — DX: Hyperlipidemia, unspecified: E78.5

## 2013-10-23 HISTORY — DX: Morbid (severe) obesity due to excess calories: E66.01

## 2013-10-23 LAB — BASIC METABOLIC PANEL
BUN: 10 mg/dL (ref 6–23)
CHLORIDE: 103 meq/L (ref 96–112)
CO2: 24 mEq/L (ref 19–32)
Calcium: 9.6 mg/dL (ref 8.4–10.5)
Creatinine, Ser: 0.86 mg/dL (ref 0.50–1.35)
GFR calc non Af Amer: 88 mL/min — ABNORMAL LOW (ref >90)
Glucose, Bld: 143 mg/dL — ABNORMAL HIGH (ref 70–99)
POTASSIUM: 4.1 meq/L (ref 3.7–5.3)
Sodium: 141 mEq/L (ref 137–147)

## 2013-10-23 LAB — HEMOGLOBIN A1C
HEMOGLOBIN A1C: 6.3 % — AB (ref <5.7)
Mean Plasma Glucose: 134 mg/dL — ABNORMAL HIGH (ref <117)

## 2013-10-23 LAB — CBC WITH DIFFERENTIAL/PLATELET
BASOS PCT: 0 % (ref 0–1)
Basophils Absolute: 0 10*3/uL (ref 0.0–0.1)
Eosinophils Absolute: 0.1 10*3/uL (ref 0.0–0.7)
Eosinophils Relative: 2 % (ref 0–5)
HCT: 50.4 % (ref 39.0–52.0)
Hemoglobin: 16.4 g/dL (ref 13.0–17.0)
Lymphocytes Relative: 31 % (ref 12–46)
Lymphs Abs: 2.2 10*3/uL (ref 0.7–4.0)
MCH: 29.6 pg (ref 26.0–34.0)
MCHC: 32.5 g/dL (ref 30.0–36.0)
MCV: 91 fL (ref 78.0–100.0)
MONO ABS: 0.5 10*3/uL (ref 0.1–1.0)
Monocytes Relative: 7 % (ref 3–12)
NEUTROS ABS: 4.2 10*3/uL (ref 1.7–7.7)
NEUTROS PCT: 60 % (ref 43–77)
Platelets: 246 10*3/uL (ref 150–400)
RBC: 5.54 MIL/uL (ref 4.22–5.81)
RDW: 13.9 % (ref 11.5–15.5)
WBC: 7.1 10*3/uL (ref 4.0–10.5)

## 2013-10-23 LAB — TSH: TSH: 3.06 u[IU]/mL (ref 0.350–4.500)

## 2013-10-23 LAB — TROPONIN I: Troponin I: <0.3 ng/mL (ref <0.30)

## 2013-10-23 LAB — I-STAT TROPONIN, ED: TROPONIN I, POC: 0 ng/mL (ref 0.00–0.08)

## 2013-10-23 LAB — PRO B NATRIURETIC PEPTIDE: Pro B Natriuretic peptide (BNP): 589.5 pg/mL — ABNORMAL HIGH (ref 0–125)

## 2013-10-23 MED ORDER — DILTIAZEM HCL 100 MG IV SOLR
10.0000 mg/h | INTRAVENOUS | Status: DC
  Administered 2013-10-23 (×2): 10 mg/h via INTRAVENOUS
  Filled 2013-10-23 (×3): qty 100

## 2013-10-23 MED ORDER — ASPIRIN 81 MG PO CHEW
324.0000 mg | CHEWABLE_TABLET | Freq: Once | ORAL | Status: AC
  Administered 2013-10-23: 324 mg via ORAL
  Filled 2013-10-23: qty 4

## 2013-10-23 MED ORDER — SODIUM CHLORIDE 0.9 % IJ SOLN: 3.0000 mL | Freq: Two times a day (BID) | INTRAMUSCULAR | Status: DC

## 2013-10-23 MED ORDER — PERFLUTREN LIPID MICROSPHERE
1.0000 mL | INTRAVENOUS | Status: AC | PRN
  Administered 2013-10-23: 3 mL via INTRAVENOUS
  Filled 2013-10-23: qty 10

## 2013-10-23 MED ORDER — CLOTRIMAZOLE 1 % EX CREA
TOPICAL_CREAM | Freq: Two times a day (BID) | CUTANEOUS | Status: DC
Start: 2013-10-23 — End: 2013-10-24
  Administered 2013-10-23: 17:00:00 via TOPICAL
  Filled 2013-10-23 (×2): qty 15

## 2013-10-23 MED ORDER — NIACIN 500 MG PO TABS
500.0000 mg | ORAL_TABLET | Freq: Two times a day (BID) | ORAL | Status: DC
  Administered 2013-10-23 – 2013-10-24 (×2): 500 mg via ORAL
  Filled 2013-10-23 (×4): qty 1

## 2013-10-23 MED ORDER — RIVAROXABAN 20 MG PO TABS
20.0000 mg | ORAL_TABLET | Freq: Every day | ORAL | Status: DC
  Administered 2013-10-23: 20 mg via ORAL
  Filled 2013-10-23 (×2): qty 1

## 2013-10-23 MED ORDER — METOPROLOL TARTRATE 50 MG PO TABS
50.0000 mg | ORAL_TABLET | Freq: Two times a day (BID) | ORAL | Status: DC
  Administered 2013-10-23 – 2013-10-24 (×3): 50 mg via ORAL
  Filled 2013-10-23 (×4): qty 1

## 2013-10-23 MED ORDER — PERFLUTREN LIPID MICROSPHERE
INTRAVENOUS | Status: AC
  Filled 2013-10-23: qty 10

## 2013-10-23 MED ORDER — DILTIAZEM HCL ER COATED BEADS 180 MG PO CP24
180.0000 mg | ORAL_CAPSULE | Freq: Every day | ORAL | Status: DC
Start: 2013-10-23 — End: 2013-10-24
  Administered 2013-10-23 – 2013-10-24 (×2): 180 mg via ORAL
  Filled 2013-10-23 (×3): qty 1

## 2013-10-23 MED ORDER — SODIUM CHLORIDE 0.9 % IV SOLN: 250.0000 mL | INTRAVENOUS | Status: DC | PRN

## 2013-10-23 MED ORDER — SODIUM CHLORIDE 0.9 % IJ SOLN: 3.0000 mL | INTRAMUSCULAR | Status: DC | PRN

## 2013-10-23 MED ORDER — ADULT MULTIVITAMIN W/MINERALS CH
1.0000 | ORAL_TABLET | Freq: Every day | ORAL | Status: DC
  Administered 2013-10-23 – 2013-10-24 (×2): 1 via ORAL
  Filled 2013-10-23 (×2): qty 1

## 2013-10-23 MED ORDER — DILTIAZEM HCL 100 MG IV SOLR
5.0000 mg/h | Freq: Once | INTRAVENOUS | Status: AC
  Administered 2013-10-23: 5 mg/h via INTRAVENOUS

## 2013-10-23 NOTE — Progress Notes (Signed)
   Subjective:    Patient ID: Stephen Francis, male    DOB: 1945/09/13, 68 y.o.   MRN: [id number]  HPI 1 wk hx sob dyspnea on exertion  gets sob easily he denies any cp sleeps in recliner hx of a fib but has been in sinus rhythm     Review of Systems     Objective:   Physical Exam diaphoretic male acutely sob  Chest exam: decreased breath sounds in bases Heart: very rapid rate without murmur extremities without edema         Assessment & Plan:  Atrial fibrillation rapid ventricular response

## 2013-10-23 NOTE — ED Notes (Signed)
Pt from UC via [facility] with c/o increased SOB for the last week.  Pt has a hx of a-fib, rate was in the 170s.  Given 20 mg total of Cardizem via EMS, rate now in the 110-140s.  Pt in NAD, A&O.

## 2013-10-23 NOTE — H&P (Signed)
History and Physical   Admit date: 10/23/2013 Name:  Stephen Francis Medical record number: [id number] DOB/Age:  02/04/1946  68 y.o. male  Referring Physician:   [facility]  Primary Cardiologist:  Dr. Tollie Eth  Primary Physician:  Dr. [name]  Chief complaint/reason for admission: Dyspnea, atrial fibrillatio  HPI:  This 68 year old male had a history of atrial fibrillation diagnosed in 2009 by his primary physician. At that time he was worked up and had a low [place] score and was placed on metoprolol. A subsequent event monitor did not show recurrence of atrial fibrillation and he is largely done well over the years. He has severe morbid obesity. An echocardiogram was normal at the time. He presented to his primary physician's office this morning with a one-week history of progressive dyspnea on exertion and was found to be in rapid atrial fibrillation. He was sent to the emergency room and placed on intravenous diltiazem. He has not had any chest pain suggestive of angina. He denies PND, orthopnea or edema.   Past Medical History  Diagnosis Date  . Atrial fibrillation   . Morbid obesity   . Hyperlipidemia        Past Surgical History  Procedure Laterality Date  . Tonsilectomy, adenoidectomy, bilateral myringotomy and tubes    . Tonsillectomy     Allergies: has No Known Allergies.   Medications: Prior to Admission medications   Medication Sig Start Date End Date Taking? Authorizing Provider  aspirin 81 MG tablet Take 81 mg by mouth daily.   Yes Historical Provider, MD  Cholecalciferol (VITAMIN D-3) 1000 UNITS CAPS Take 2,000 Units by mouth.   Yes Historical Provider, MD  clotrimazole-betamethasone (LOTRISONE) cream Apply topically 2 (two) times daily. 10/08/12  Yes Darlyne Russian, MD  fish oil-omega-3 fatty acids 1000 MG capsule Take 1 g by mouth 2 (two) times daily.    Yes Historical Provider, MD  loratadine (CLARITIN) 10 MG tablet Take 10 mg by mouth daily as needed  for allergies.   Yes Historical Provider, MD  metoprolol (LOPRESSOR) 50 MG tablet Take 1 tablet (50 mg total) by mouth 2 (two) times daily. Needs office visit/CPE 09/22/12  Yes Collene Leyden, PA-C  Multiple Vitamin (MULTIVITAMIN) tablet Take 1 tablet by mouth daily.   Yes Historical Provider, MD  niacin 500 MG tablet Take 500 mg by mouth 2 (two) times daily with a meal.   Yes Historical Provider, MD  sildenafil (VIAGRA) 100 MG tablet Take 0.5-1 tablets (50-100 mg total) by mouth daily as needed for erectile dysfunction. 04/09/13  Yes Darlyne Russian, MD   Family History:  Family Status  Relation Status Death Age  . Mother Deceased   . Father Deceased   . Sister Alive   . Brother Deceased    Social History:   reports that he has never smoked. He has quit using smokeless tobacco. His smokeless tobacco use included Chew. He reports that he drinks alcohol. He reports that he does not use illicit drugs.   History   Social History Narrative  . No narrative on file    Review of Systems: Patient has a history of rosacea. He also has known erectile dysfunction and also an elevated PSA. No history of GI bleeding. 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 Also frequent snoring and periodic breathing. Other than as noted above, the remainder of the review of systems is normal  Physical Exam: BP 109/73  Pulse 72  Temp(Src) 98.8 F (37.1  C) (Oral)  Resp 22  SpO2 96% General appearance: Pleasant obese male in NAD Head: Normocephalic, without obvious abnormality, atraumatic Eyes: conjunctivae/corneas clear. PERRL, EOM's intact. Fundi not examined Neck: no adenopathy, no carotid bruit, no JVD and supple, symmetrical, trachea midline Lungs: clear to auscultation bilaterally Heart: irregular rate and rhythm, S1, S2 normal, no murmur, click, rub or gallop Abdomen: soft, non-tender; bowel sounds normal; no masses,  no organomegaly Rectal: deferred Extremities: extremities normal, atraumatic,  no cyanosis or edema Pulses: 2+ and symmetric Neurologic: Grossly normal  Labs: CBC  Recent Labs  10/23/13 0921  WBC 7.1  RBC 5.54  HGB 16.4  HCT 50.4  PLT 246  MCV 91.0  MCH 29.6  MCHC 32.5  RDW 13.9  LYMPHSABS 2.2  MONOABS 0.5  EOSABS 0.1  BASOSABS 0.0   CMP   Recent Labs  10/23/13 0921  NA 141  K 4.1  CL 103  CO2 24  GLUCOSE 143*  BUN 10  CREATININE 0.86  CALCIUM 9.6  GFRNONAA 88*  GFRAA >90   BNP (last 3 results)  Recent Labs  10/23/13 0921  PROBNP 589.5*   Cardiac Panel (last 3 results)   Recent Labs  10/23/13 0925  TROPIPOC 0.00    Recent Labs  10/23/13 0925  TROPIPOC 0.00   Thyroid  Lab Results  Component Value Date   TSH 5.320* 04/08/2013    EKG: Atrial fibrillation with RVR, left axis deviation  Radiology: No acute disease   IMPRESSIONS: 1. Recurrent paroxysmal atrial fibrillation now with rapid ventricular response Probable duration of onset around 1 week ago clinically 2. Morbid obesity 3. Hyperlipidemia 4. Glucose intolerance 5. Snoring  PLAN:  He evidently has glucose intolerance now and is now 67. He will be anticoagulated with Xarelto. We will obtain a repeat 2-D echocardiogram and continue medication for rate control. He would prefer delayed cardioversion to TEE cardioversion at the present time.  Signed: Kerry Hough MD [facility]  10/23/2013, 11:37 AM

## 2013-10-23 NOTE — ED Notes (Signed)
20 gage IV in right hand infiltrated.  Removed.  Site edematous, not bleeding.

## 2013-10-23 NOTE — Progress Notes (Signed)
  Echocardiogram 2D Echocardiogram (with Definity) has been performed.  Basilia Jumbo 10/23/2013, 4:53 PM

## 2013-10-23 NOTE — ED Provider Notes (Signed)
CSN: [id number]     Arrival date & time 10/23/13  0272 History   First MD Initiated Contact with Patient 10/23/13 [phone]     Chief Complaint  Patient presents with  . Shortness of Breath  . Atrial Fibrillation     (Consider location/radiation/quality/duration/timing/severity/associated sxs/prior Treatment) Patient is a 68 y.o. male presenting with shortness of breath and atrial fibrillation. The history is provided by the patient and medical records.  Shortness of Breath Atrial Fibrillation  This is a 68 year old male with past medical history significant for paroxysmal A. fib not currently on anticoagulation, obesity, hypertension, presenting to the ED with A. fib RVR. Patient was seen in his primary care office for routine checkup and was found to be in AFIB RVR with rate initially in the 170's.  Pt denies any chest pain.  Does not some increasing SOB over the past several days with increased night time orthopnea.  No significant lower extremity edema or weight gain.  No palpitations, states he does not feel his episodes of AFIB so unsure how long he has been in it.  Does note a recent dry cough, no fevers or chills.  Pt is followed regularly by cardiology, Dr. Wynonia Lawman-- has FU appt scheduled for tomorrow morning.    Past Medical History  Diagnosis Date  . Atrial fibrillation   . Obesity    Past Surgical History  Procedure Laterality Date  . Tonsilectomy, adenoidectomy, bilateral myringotomy and tubes    . Tonsillectomy     Family History  Problem Relation Age of Onset  . Stroke Paternal Grandfather    History  Substance Use Topics  . Smoking status: Never Smoker   . Smokeless tobacco: Former Systems developer    Types: Chew  . Alcohol Use: Yes     Comment: rarely    Review of Systems  Respiratory: Positive for shortness of breath.   Cardiovascular:       AFIB  All other systems reviewed and are negative.     Allergies  Review of patient's allergies indicates no known  allergies.  Home Medications   Prior to Admission medications   Medication Sig Start Date End Date Taking? Authorizing Provider  aspirin 81 MG tablet Take 81 mg by mouth daily.    Historical Provider, MD  B Complex-Biotin-FA (VITAMIN B50 COMPLEX PO) Take by mouth.    Historical Provider, MD  Cholecalciferol (VITAMIN D-3) 1000 UNITS CAPS Take 2,000 Units by mouth.    Historical Provider, MD  Choline Fenofibrate (TRILIPIX) 135 MG capsule Take 1 capsule (135 mg total) by mouth daily. 04/11/12   Darlyne Russian, MD  clotrimazole-betamethasone (LOTRISONE) cream Apply topically 2 (two) times daily. 10/08/12   Darlyne Russian, MD  fish oil-omega-3 fatty acids 1000 MG capsule Take 2 g by mouth daily.    Historical Provider, MD  metoprolol (LOPRESSOR) 50 MG tablet Take 1 tablet (50 mg total) by mouth 2 (two) times daily. Needs office visit/CPE 09/22/12   Collene Leyden, PA-C  Multiple Vitamin (MULTIVITAMIN) tablet Take 1 tablet by mouth daily.    Historical Provider, MD  niacin (NIASPAN) 500 MG CR tablet Take 1 tablet (500 mg total) by mouth 1 day or 1 dose. 10/10/11 10/09/12  Darlyne Russian, MD  niacin 500 MG tablet Take 500 mg by mouth 2 (two) times daily with a meal.    Historical Provider, MD  sildenafil (VIAGRA) 100 MG tablet Take 0.5-1 tablets (50-100 mg total) by mouth daily as needed for erectile dysfunction.  04/09/13   Darlyne Russian, MD   BP 146/88  Pulse 150  Temp(Src) 98.8 F (37.1 C) (Oral)  Resp 22  SpO2 96%  Physical Exam  Nursing note and vitals reviewed. Constitutional: He is oriented to person, place, and time. He appears well-developed and well-nourished.  Morbidly obese  HENT:  Head: Normocephalic and atraumatic.  Mouth/Throat: Oropharynx is clear and moist.  Eyes: Conjunctivae and EOM are normal. Pupils are equal, round, and reactive to light.  Neck: Normal range of motion.  Cardiovascular: Normal heart sounds.  An irregularly irregular rhythm present. Tachycardia present.    AFIB  Pulmonary/Chest: Effort normal and breath sounds normal. No respiratory distress. He has no wheezes.  Abdominal: Soft. Bowel sounds are normal. There is no tenderness. There is no guarding.  Musculoskeletal: Normal range of motion.  Trace edema BLE  Neurological: He is alert and oriented to person, place, and time.  Skin: Skin is warm and dry.  Psychiatric: He has a normal mood and affect.    ED Course  Procedures (including critical care time)  CRITICAL CARE Performed by: Larene Pickett   Total critical care time: 35  Critical care time was exclusive of separately billable procedures and treating other patients.  Critical care was necessary to treat or prevent imminent or life-threatening deterioration.  Critical care was time spent personally by me on the following activities: development of treatment plan with patient and/or surrogate as well as nursing, discussions with consultants, evaluation of patient's response to treatment, examination of patient, obtaining history from patient or surrogate, ordering and performing treatments and interventions, ordering and review of laboratory studies, ordering and review of radiographic studies, pulse oximetry and re-evaluation of patient's condition.  Medications  diltiazem (CARDIZEM CD) 24 hr capsule 180 mg (not administered)  diltiazem (CARDIZEM) 100 mg in dextrose 5 % 100 mL infusion (not administered)  diltiazem (CARDIZEM) 100 mg in dextrose 5 % 100 mL infusion (5 mg/hr Intravenous New Bag/Given 10/23/13 0272)  aspirin chewable tablet 324 mg (324 mg Oral Given 10/23/13 0909)     Labs Review Labs Reviewed  BASIC METABOLIC PANEL - Abnormal; Notable for the following:    Glucose, Bld 143 (*)    GFR calc non Af Amer 88 (*)    All other components within normal limits  PRO B NATRIURETIC PEPTIDE - Abnormal; Notable for the following:    Pro B Natriuretic peptide (BNP) 589.5 (*)    All other components within normal limits   CBC WITH DIFFERENTIAL  HEMOGLOBIN A1C  I-STAT TROPOININ, ED    Imaging Review No results found.   EKG Interpretation   Date/Time:  Thursday Oct 23 2013 08:50:43 EDT Ventricular Rate:  128 PR Interval:    QRS Duration: 86 QT Interval:  370 QTC Calculation: 540 R Axis:   -68 Text Interpretation:  Afib/flut with RVR Atrial fibrillation RSR' in V1 or  V2, right VCD or RVH Inferior infarct, old Prolonged QT interval No  previous tracing Confirmed by BEATON  MD, ROBERT ([id number]) on 10/23/2013  9:03:32 AM      MDM   Final diagnoses:  Paroxysmal a-fib  Obesity  HTN (hypertension)   68 year old male with history of paroxysmal A. fib presenting to the ED in A. fib with RVR, rate in 150's after 20mg  cardizem bolus by EMS.  Pt will be started on cardizem drip for rate control.  Will obtain labs and CXR.  Likely will need cardiology admission.  Troponin is negative. Labs are reassuring,  slightly elevated BNP but without pulmonary vascular congestion or edema on chest x-ray. Patient's heart rate has slowed to 120's, cardizem drip infusing. He remains pain free. Discussed with cardiolgy, Dr. Wynonia Lawman who has evaluated pt in the ED and will admit for further management.    Larene Pickett, PA-C 10/23/13 1355

## 2013-10-24 ENCOUNTER — Encounter (HOSPITAL_COMMUNITY): Payer: Self-pay | Admitting: General Practice

## 2013-10-24 LAB — TROPONIN I: Troponin I: <0.3 ng/mL (ref <0.30)

## 2013-10-24 MED ORDER — RIVAROXABAN 20 MG PO TABS: 20.0000 mg | ORAL_TABLET | Freq: Every day | ORAL | Status: DC

## 2013-10-24 MED ORDER — DILTIAZEM HCL ER COATED BEADS 180 MG PO CP24: 180.0000 mg | ORAL_CAPSULE | Freq: Every day | ORAL | Status: DC

## 2013-10-24 NOTE — Progress Notes (Signed)
Subjective:  Feeling fine today.  Not SOB.  C/o mild cough. Also notes some sweats.  Objective:  Vital Signs in the last 24 hours: BP 121/63  Pulse 81  Temp(Src) 97.9 F (36.6 C) (Oral)  Resp 22  Ht 6\' 3"  (1.905 m)  Wt 173.6 kg (382 lb 11.5 oz)  BMI 47.84 kg/m2  SpO2 95%  Physical Exam: Pleasant very large WM in NAD Lungs:  Clear Cardiac:  irregular rhythm, normal S1 and S2, no S3 Abdomen:  Soft, nontender, no masses Extremities:  No edema present  Intake/Output from previous day: 05/28 0701 - 05/29 0700 In: 240 [P.O.:240] Out: -   Weight Filed Weights   10/23/13 1418  Weight: 173.6 kg (382 lb 11.5 oz)    Lab Results: Basic Metabolic Panel:  Recent Labs  10/23/13 0921  NA 141  K 4.1  CL 103  CO2 24  GLUCOSE 143*  BUN 10  CREATININE 0.86   CBC:  Recent Labs  10/23/13 0921  WBC 7.1  NEUTROABS 4.2  HGB 16.4  HCT 50.4  MCV 91.0  PLT 246   Cardiac Enzymes:  Recent Labs  10/23/13 1349 10/23/13 1949 10/24/13 0125  TROPONINI <0.30 <0.30 <0.30    Telemetry: A fib with controlled response  Assessment/Plan:  1. A fib unknown duration of onset.  Rec:  D?c IV Cardiazem and walk.  If stab le home later on Xarelto and f/u with possible cardioversion down the road.      Kerry Hough  MD [facility]  10/24/2013, 8:52 AM

## 2013-10-24 NOTE — Progress Notes (Signed)
10/24/13 0930 10/24/13 1000 10/24/13 1200  Mobility  Activity Ambulate in hall Ambulate in hall Ambulate in hall  Level of [place] None None None  Distance Ambulated (ft) 250 ft 300 ft 300 ft  Ambulation Response Tolerated well Tolerated well Tolerated well

## 2013-10-24 NOTE — Discharge Summary (Signed)
Physician Discharge Summary  Patient ID: TIGER SPIEKER MRN: [id number] DOB/AGE: 01-11-1946 68 y.o.  Admit date: 10/23/2013 Discharge date: 10/24/2013  Primary Physician:  Dr. Ivar Bury  Primary Discharge Diagnosis:  1. Atrial fibrillation with rapid ventricular response of undetermined age of onset  Secondary Discharge Diagnosis: 2. Morbid obesity 3. Long-term anticoagulation with XARELTO initiated this admission 4. Pre-diabetes with hemoglobin A1c of 6.3  Procedures:  Echocardiogram  Hospital Course: This 68 year old male has a history of paroxysmal atrial fibrillation. He presented to his primary care physician's office for routine followup and was found to be in atrial fibrillation with rapid response. He was sent over here by ambulance. After arrival to the emergency room he was placed on intravenous diltiazem. This resulted in some slowing of his atrial fibrillation rates and a was asked to see him. He was kept overnight for observation and was placed back on metoprolol and placed on oral diltiazem. His atrial fibrillation rate was better controlled next morning and he was transitioned to oral therapy. He was ambulatory in the hall without recurrent problems and was discharged home in improved condition. He was started on XARELTO while he was in the hospital and stated that he would prefer to do a delayed cardioversion versus having a TEE cardioversion. His age with fibrillation rate was better controlled and we will consider a cardioversion once he is been anticoagulated for the next 3-4 weeks.  He was complaining of a slight cough as well as mild dyspnea as well as some sweats and we think may have attributed to the atrial fibrillation.  Discharge Exam: Blood pressure 119/64, pulse 85, temperature 97.9 F (36.6 C), temperature source Oral, resp. rate 22, height 6\' 3"  (1.905 m), weight 173.6 kg (382 lb 11.5 oz), SpO2 95.00%. Weight: 173.6 kg (382 lb 11.5 oz) Is clear, no  S3  Labs: CBC:   Lab Results  Component Value Date   WBC 7.1 10/23/2013   HGB 16.4 10/23/2013   HCT 50.4 10/23/2013   MCV 91.0 10/23/2013   PLT 246 10/23/2013    CMP:  Recent Labs Lab 10/23/13 0921  NA 141  K 4.1  CL 103  CO2 24  BUN 10  CREATININE 0.86  CALCIUM 9.6  GLUCOSE 143*   Lipid Panel     Component Value Date/Time   CHOL 182 04/08/2013 0831   TRIG 210* 04/08/2013 0831   HDL 26* 04/08/2013 0831   CHOLHDL 7.0 04/08/2013 0831   VLDL 42* 04/08/2013 0831   LDLCALC 114* 04/08/2013 0831   Cardiac Enzymes:  Recent Labs  10/23/13 1349 10/23/13 1949 10/24/13 0125  TROPONINI <0.30 <0.30 <0.30    BNP (last 3 results)  Recent Labs  10/23/13 0921  PROBNP 589.5*    Protime: No components found with this basename: PT,   Thyroid: Lab Results  Component Value Date   TSH 3.060 10/23/2013    Hemoglobin A1C: Lab Results  Component Value Date   HGBA1C 6.3* 10/23/2013     Radiology: Of cardiomegaly, mild elevation of the right hemidiaphragm, no acute cardiopulmonary disease  ECHO: Ejection fraction of 65%. Mild left atrial enlargement  EKG: Atrial fibrillation with rapid ventricular spots, left axis deviation  Discharge Medications:   Medication List    STOP taking these medications       aspirin 81 MG tablet      TAKE these medications       clotrimazole-betamethasone cream  Commonly known as:  LOTRISONE  Apply topically 2 (two) times daily.  diltiazem 180 MG 24 hr capsule  Commonly known as:  CARDIZEM CD  Take 1 capsule (180 mg total) by mouth daily.     fish oil-omega-3 fatty acids 1000 MG capsule  Take 1 g by mouth 2 (two) times daily.     loratadine 10 MG tablet  Commonly known as:  CLARITIN  Take 10 mg by mouth daily as needed for allergies.     metoprolol 50 MG tablet  Commonly known as:  LOPRESSOR  Take 1 tablet (50 mg total) by mouth 2 (two) times daily. Needs office visit/CPE     multivitamin tablet  Take 1 tablet  by mouth daily.     niacin 500 MG tablet  Take 500 mg by mouth 2 (two) times daily with a meal.     rivaroxaban 20 MG Tabs tablet  Commonly known as:  XARELTO  Take 1 tablet (20 mg total) by mouth daily with supper.     sildenafil 100 MG tablet  Commonly known as:  VIAGRA  Take 0.5-1 tablets (50-100 mg total) by mouth daily as needed for erectile dysfunction.     Vitamin D-3 1000 UNITS Caps  Take 2,000 Units by mouth.       Followup plans and appointments: Followup Dr. Wynonia Lawman in one week  Time spent with patient to include physician time:  30 minutes  Signed: W. Doristine Church. MD [facility] 10/24/2013, 1:30 PM

## 2013-10-24 NOTE — Care Management Note (Signed)
    Page 1 of 1   10/24/2013     3:24:50 PM CARE MANAGEMENT NOTE 10/24/2013  Patient:  Stephen Francis, Stephen Francis   Account Number:  [id number]  Date Initiated:  10/24/2013  Documentation initiated by:  Mazie Fencl  Subjective/Objective Assessment:   Pt adm on 10/23/13 with Afib.  PTA, pt independent of ADLS.     Action/Plan:   Pt to dc on Xarelto.  Unable to give pt 30 day free trial card, as pt was dc'd prior to Case Mgr visit.  RN states he was going to MD office to get samples.  Bedside RN states she will mail Xarelto card to pt.   Anticipated DC Date:  10/24/2013   Anticipated DC Plan:  [place]  CM consult  Medication Assistance      Choice offered to / List presented to:             Status of service:  Completed, signed off Medicare Important Message given?   (If response is "NO", the following Medicare IM given date fields will be blank) Date Medicare IM given:   Date Additional Medicare IM given:    Discharge Disposition:  HOME/SELF CARE  Per UR Regulation:  Reviewed for med. necessity/level of care/duration of stay  If discussed at [place] of Stay Meetings, dates discussed:    Comments:

## 2013-10-24 NOTE — ED Provider Notes (Signed)
Medical screening examination/treatment/procedure(s) were conducted as a shared visit with non-physician practitioner(s) and myself.  I personally evaluated the patient during the encounter   .Face to face Exam:  General:  A&Ox3 HEENT:  Atraumatic Resp:  Normal effort Abd:  Nondistended Neuro:No focal deficits    Dot Lanes, MD 10/24/13 [phone]

## 2013-10-24 NOTE — Progress Notes (Signed)
Discharge teaching done with pt and wife. Pt dc'd home with wife.

## 2013-10-25 ENCOUNTER — Telehealth: Payer: Self-pay | Admitting: Emergency Medicine

## 2013-10-25 NOTE — Telephone Encounter (Signed)
Patient requests return call from Dr. Everlene Farrier regarding medication/hospital visit from when he was seen by Dr. Everlene Farrier on 10/23/13 and transported to the ED.  Please call the patient at [phone]

## 2013-10-28 ENCOUNTER — Ambulatory Visit (INDEPENDENT_AMBULATORY_CARE_PROVIDER_SITE_OTHER): Payer: Medicare Other | Admitting: Emergency Medicine

## 2013-10-28 ENCOUNTER — Encounter: Payer: Self-pay | Admitting: Emergency Medicine

## 2013-10-28 ENCOUNTER — Encounter: Payer: Self-pay | Admitting: Cardiology

## 2013-10-28 VITALS — BP 150/78 | HR 87 | Temp 98.1°F | Resp 16 | Ht 72.0 in | Wt 384.6 lb

## 2013-10-28 DIAGNOSIS — R030 Elevated blood-pressure reading, without diagnosis of hypertension: Secondary | ICD-10-CM

## 2013-10-28 DIAGNOSIS — I4891 Unspecified atrial fibrillation: Secondary | ICD-10-CM

## 2013-10-28 DIAGNOSIS — E1110 Type 2 diabetes mellitus with ketoacidosis without coma: Secondary | ICD-10-CM

## 2013-10-28 DIAGNOSIS — E669 Obesity, unspecified: Secondary | ICD-10-CM

## 2013-10-28 DIAGNOSIS — E1165 Type 2 diabetes mellitus with hyperglycemia: Secondary | ICD-10-CM

## 2013-10-28 DIAGNOSIS — IMO0001 Reserved for inherently not codable concepts without codable children: Secondary | ICD-10-CM

## 2013-10-28 DIAGNOSIS — R0602 Shortness of breath: Secondary | ICD-10-CM

## 2013-10-28 DIAGNOSIS — R739 Hyperglycemia, unspecified: Secondary | ICD-10-CM

## 2013-10-28 DIAGNOSIS — R059 Cough, unspecified: Secondary | ICD-10-CM

## 2013-10-28 DIAGNOSIS — R05 Cough: Secondary | ICD-10-CM

## 2013-10-28 MED ORDER — DILTIAZEM HCL ER COATED BEADS 180 MG PO CP24: 180.0000 mg | ORAL_CAPSULE | Freq: Every day | ORAL | Status: DC

## 2013-10-28 MED ORDER — BENZONATATE 100 MG PO CAPS: 100.0000 mg | ORAL_CAPSULE | Freq: Three times a day (TID) | ORAL | Status: DC | PRN

## 2013-10-28 NOTE — Telephone Encounter (Signed)
Done, patient coming today

## 2013-10-28 NOTE — Progress Notes (Signed)
   Subjective:    Patient ID: Stephen Francis, male    DOB: April 13, 1946, 68 y.o.   MRN: [id number]  HPI patient here for followup. He is doing extremely well. Since his discharge from the hospital. He does suffer from a persistent cough. His rhythm has been maintained in sinus. He had an appointment this morning and solidly cardiologist. Chest x-ray done in the hospital was unremarkable. He does still suffer from a cough. This cough is nonproductive    Review of Systems     Objective:   Physical Exam patient is alert and cooperative not in distress. His neck is supple. His chest is clear to auscultation and percussion. Cardiac reveals a regular rate and rhythm .        Assessment & Plan:  Plan not clear why he has a cough. Could be allergy related. I did give him some Tessalon Perles. I sent off a prescription for his Cardizem he is placed on by the cardiologist. His sugars up and he is to be on a diet for this. We'll recheck a hemoglobin A1c and glucose in 3 months .

## 2013-10-28 NOTE — Progress Notes (Unsigned)
Patient ID: Stephen Francis, male   DOB: Aug 22, 1945, 68 y.o.   MRN: [id number]    Stephen, Francis  Date of visit:  10/28/2013 DOB:  June 20, 1945    Age:  68 yrs. Medical record number:  [id number]     Account number:  [id number] Primary Care Provider: [name] Francis ____________________________ CURRENT DIAGNOSES  1. Arrhythmia-Atrial Fibrillation  2. Long Term Use Anticoagulant  3. Diabetes Mellitus-NIDD  4. Obesity, morbid (BMI>40)  5. Hypertension,Essential (Benign) ____________________________ ALLERGIES  No Known Allergies ____________________________ MEDICATIONS  1. Multi-Day tablet, 1 p.o. daily  2. Fish Oil 1,000 mg Capsule, 1 p.o. daily  3. metoprolol tartrate 50 mg tablet, BID  4. diltiazem ER 180 mg capsule,extended release, 1 p.o. daily  5. loratadine 10 mg tablet, PRN  6. niacin 500 mg tablet, BID  7. Viagra 100 mg tablet, PRN  8. Vitamin D3 1,000 unit tablet, 2 qd  9. Xarelto 20 mg tablet, 1 p.o. daily ____________________________ CHIEF COMPLAINTS  Followup of Arrhythmia-Atrial Fibrillation ____________________________ HISTORY OF PRESENT ILLNESS Patient seen for cardiac followup. He was hospitalized with paroxysmal atrial fibrillation with rapid response. His ventricular function was normal and he did duration of atrial fibrillation was unknown so he was anticoagulated with Xarelto. He was discharged home the next day rate controlled. Since going home he had an episode yesterday where he felt as if he would pitch forward if he was walking but did not have vertigo or other neurologic symptoms. He was supposed to see me Friday but comes in today. He is feeling better today and does not have any recurrent neurologic complaints and feels as if the walking is better. He denies angina he does have some mild dyspnea. He was anticoagulated with Xarelto because of the CHADS2VASC score 2-3. He is concerned that he will be able to take Xarelto because of the expense of  it. ____________________________ PAST HISTORY  Past Medical Illnesses:  morbid obesity, glucose intolerance, nephrolithiasis, hyperlipidemia;  Cardiovascular Illnesses:  atrial fibrillation;  Infectious Diseases:  no previous history of significant infectious diseases;  Surgical Procedures:  tonsillectomy;  Trauma History:  no previous history of significant trauma;  Cardiology Procedures-Invasive:  no previous interventional or invasive cardiology procedures;  Cardiology Procedures-Noninvasive:  chest CT December 2009, echocardiogram December 2009;  LVEF of 60% documented via echocardiogram on 05/06/2008,   ____________________________ CARDIO-PULMONARY TEST DATES EKG Date:  10/28/2013;  Holter/Event Monitor Date: 05/12/2008;  Echocardiography Date: 05/06/2008;  Chest Xray Date: 04/28/2008;  CT Scan Date:  04/30/2008   ____________________________ FAMILY HISTORY Brother -- Brother dead, Death of unknown cause Father -- Father dead, Death of unknown cause Mother -- Mother dead, Death of unknown cause Sister -- Sister alive and well ____________________________ SOCIAL HISTORY Alcohol Use:  does not use alcohol;  Smoking:  never smoked;  Diet:  regular diet;  Lifestyle:  married and 1 son;  Exercise:  no regular exercise;  Occupation:  repaired, Equities trader;  Residence:  lives with wife;   ____________________________ REVIEW OF SYSTEMS General:  longstanding obesity  Integumentary:rosacea Eyes: denies diplopia, history of glaucoma or visual problems. Ears, Nose, Throat, Mouth:  partial hearing loss right ear Respiratory: denies dyspnea, cough, wheezing or hemoptysis. Cardiovascular:  please review HPI  Genitourinary-Male: no dysuria, urgency, frequency, or nocturia  Musculoskeletal:  arthritis of the right knee, arthritis of the left shoulder Neurological:  dizziness  ____________________________ PHYSICAL EXAMINATION VITAL SIGNS  Blood Pressure:  140/80 Sitting, Right arm, large cuff  ,  146/86 Standing, Right arm  and large cuff   Pulse:  88/min. Weight:  370.00 lbs. Height:  72"BMI: 50  Constitutional:  pleasant white male in no acute distress, severely obese Skin:  rosacea face Head:  normocephalic, normal hair pattern, no masses or tenderness ENT:  ears, nose and throat reveal no gross abnormalities.  Dentition good. Neck:  supple, without massess. No JVD, thyromegaly or carotid bruits. Carotid upstroke normal. Chest:  normal symmetry, clear to auscultation. Cardiac:  regular rhythm, normal S1 and S2, no S3 or S4, no murmurs,clicks, or rub heard Peripheral Pulses:  the femoral,dorsalis pedis, and posterior tibial pulses are full and equal bilaterally with no bruits auscultated. Extremities & Back:  no deformities, clubbing, cyanosis, erythema or edema observed. Normal muscle strength and tone. Neurological:  no gross motor or sensory deficits noted, affect appropriate, oriented x3. ____________________________ MOST RECENT LIPID PANEL 04/08/13  CHOL TOTL 182 mg/dl, LDL 114 NM, HDL 26 mg/dl, TRIGLYCER 210 mg/dl and CHOL/HDL 7.0 (Calc) ____________________________ IMPRESSIONS/PLAN  1. Paroxysmal atrial fibrillation back in sinus rhythm today 2. Morbid obesity 3. Long-term anticoagulation 4. Hypertension 5. Glucose intolerance  Recommendations:  Long discussion about atrial fibrillation. We discussed alternate anticoagulation with warfarin to save money but he thinks that he probably would just take Xarelto instead. He was given samples of Xarelto and we discussed the need to bridge the warfarin if he wants to go on it. I would like for him to have Francis sleep study. We discussed the importance of weight loss and blood pressure control. Followup in 3 months. His EKG shows left axis deviation, sinus rhythm with PACs today. ____________________________ TODAYS ORDERS  1. 12 Lead EKG: Today  2. Return Visit: 3 months  3. 12 Lead EKG: 3 months  4. Sleep Study with c-pap: At  [facility]                       ____________________________ Cardiology Physician:  Stephen Hough MD [facility]

## 2013-10-28 NOTE — Patient Instructions (Signed)

## 2013-10-28 NOTE — Telephone Encounter (Signed)
Rayburn needs to come in to see me in the next couple of weeks. If he needs medication refills we can do that. Please give him an appointment time to be seen as a work if necessary in the next couple weeks

## 2013-10-30 ENCOUNTER — Telehealth: Payer: Self-pay | Admitting: *Deleted

## 2013-10-30 NOTE — Telephone Encounter (Signed)
Faxed prescription for CARDIZEM 180 MG to pharmacy ([facility]) on 10/28/2013, per Dr Everlene Farrier.

## 2013-12-23 LAB — HM DIABETES EYE EXAM

## 2014-01-29 ENCOUNTER — Ambulatory Visit (INDEPENDENT_AMBULATORY_CARE_PROVIDER_SITE_OTHER): Payer: Medicare Other | Admitting: Emergency Medicine

## 2014-01-29 ENCOUNTER — Encounter: Payer: Self-pay | Admitting: Emergency Medicine

## 2014-01-29 VITALS — BP 122/80 | HR 84 | Temp 98.1°F | Resp 16 | Wt 367.0 lb

## 2014-01-29 DIAGNOSIS — E785 Hyperlipidemia, unspecified: Secondary | ICD-10-CM

## 2014-01-29 DIAGNOSIS — I482 Chronic atrial fibrillation, unspecified: Secondary | ICD-10-CM

## 2014-01-29 DIAGNOSIS — I4891 Unspecified atrial fibrillation: Secondary | ICD-10-CM

## 2014-01-29 DIAGNOSIS — Z23 Encounter for immunization: Secondary | ICD-10-CM

## 2014-01-29 DIAGNOSIS — R7309 Other abnormal glucose: Secondary | ICD-10-CM

## 2014-01-29 LAB — LIPID PANEL
CHOL/HDL RATIO: 6.1 ratio
Cholesterol: 178 mg/dL (ref 0–200)
HDL: 29 mg/dL — AB (ref >39)
LDL Cholesterol: 111 mg/dL — ABNORMAL HIGH (ref 0–99)
Triglycerides: 190 mg/dL — ABNORMAL HIGH (ref <150)
VLDL: 38 mg/dL (ref 0–40)

## 2014-01-29 LAB — POCT GLYCOSYLATED HEMOGLOBIN (HGB A1C): Hemoglobin A1C: 5.6

## 2014-01-29 LAB — GLUCOSE, POCT (MANUAL RESULT ENTRY): POC Glucose: 128 mg/dl — AB (ref 70–99)

## 2014-01-29 NOTE — Progress Notes (Signed)
Subjective:  This chart was scribed for Stephen Russian, MD by Ladene Artist, ED Scribe. The patient was seen in room 21. Patient's care was started at 9:32 AM.   Patient ID: Stephen Francis, male    DOB: 23-Sep-1945, 68 y.o.   MRN: [id number]  Chief Complaint  Patient presents with  . Follow-up  . Atrial Fibrillation   HPI HPI Comments: [name] is a 68 y.o. male, with a h/o A-fib, hyperlipidemia, who presents to the [facility] follow-up regarding A-fib. Pt states that he is doing fine overall. Pt states that he does not like taking Xarelto; he would rather just take ASA due to commercials that he has seen. Next appointment with Cardiologist is at the end of the month. Pt saw Cardiologist in June with normal EKG. Pt has lost 20 lbs since last seen in June by changing his diet. Pt has eliminated soda and burgers. He reports drinking more water and steak with vegetables instead. Pt reports HR in 60s after taking Cardizem.  Pt has never had a sleep study. His wife denies snoring but reports deep breathing while sleeping.   Pt also requests to have various skin spots examined during this visit. He states that he has seen a dermatologist for this.  Pt also interested in trying a different ED drug. He states that he has minimal help on Viagra and is interested in daily cialis.   Past Medical History  Diagnosis Date  . Atrial fibrillation   . Morbid obesity   . Hyperlipidemia   . Dysrhythmia     ATRIAL FIBRILATION   Current Outpatient Prescriptions on File Prior to Visit  Medication Sig Dispense Refill  . benzonatate (TESSALON) 100 MG capsule Take 1-2 capsules (100-200 mg total) by mouth 3 (three) times daily as needed for cough.  40 capsule  0  . Cholecalciferol (VITAMIN D-3) 1000 UNITS CAPS Take 2,000 Units by mouth.      . clotrimazole-betamethasone (LOTRISONE) cream Apply topically 2 (two) times daily.  90 g  1  . diltiazem (CARDIZEM CD) 180 MG 24 hr  capsule Take 1 capsule (180 mg total) by mouth daily.  90 capsule  3  . fish oil-omega-3 fatty acids 1000 MG capsule Take 1 g by mouth 2 (two) times daily.       Marland Kitchen loratadine (CLARITIN) 10 MG tablet Take 10 mg by mouth daily as needed for allergies.      . metoprolol (LOPRESSOR) 50 MG tablet Take 1 tablet (50 mg total) by mouth 2 (two) times daily. Needs office visit/CPE  60 tablet  0  . Multiple Vitamin (MULTIVITAMIN) tablet Take 1 tablet by mouth daily.      . niacin 500 MG tablet Take 500 mg by mouth 2 (two) times daily with a meal.      . rivaroxaban (XARELTO) 20 MG TABS tablet Take 1 tablet (20 mg total) by mouth daily with supper.  30 tablet  12  . sildenafil (VIAGRA) 100 MG tablet Take 0.5-1 tablets (50-100 mg total) by mouth daily as needed for erectile dysfunction.  18 tablet  3   No current facility-administered medications on file prior to visit.   No Known Allergies  Review of Systems  Constitutional: Negative for fever and chills.  Respiratory: Negative for cough.   Skin:       Skin spots  Neurological: Negative for dizziness and light-headedness.      Objective:   Physical Exam CONSTITUTIONAL:  Well developed/well nourished HEAD: Normocephalic/atraumatic EYES: EOMI/PERRL ENMT: Mucous membranes moist NECK: supple no meningeal signs SPINE:entire spine nontender CV: S1/S2 noted, no murmurs/rubs/gallops noted LUNGS: Lungs are clear to auscultation bilaterally, no apparent distress ABDOMEN: soft, nontender, no rebound or guarding GU:no cva tenderness NEURO: Pt is awake/alert, moves all extremitiesx4 EXTREMITIES: pulses normal, full ROM SKIN: warm, color normal PSYCH: no abnormalities of mood noted    Results for orders placed in visit on 01/29/14  GLUCOSE, POCT (MANUAL RESULT ENTRY)      Result Value Ref Range   POC Glucose 128 (*) 70 - 99 mg/dl  POCT GLYCOSYLATED HEMOGLOBIN (HGB A1C)      Result Value Ref Range   Hemoglobin A1C 5.6     Assessment & Plan:  Sugar  is great. He was given flu vaccine and Prevnar today. He was congratulated regarding his weight loss. He is due to see Dr. Wynonia Lawman this month. He will discuss with him the issue about Cialis I personally performed the services described in this documentation, which was scribed in my presence. The recorded information has been reviewed and is accurate.

## 2014-02-06 ENCOUNTER — Other Ambulatory Visit: Payer: Self-pay | Admitting: Emergency Medicine

## 2014-02-06 NOTE — Telephone Encounter (Signed)
Dr Everlene Farrier, you just saw pt for check up but haven't Rx this for pt for some time. Do you want to give RFs?

## 2014-04-02 ENCOUNTER — Other Ambulatory Visit: Payer: Self-pay

## 2014-04-02 DIAGNOSIS — N529 Male erectile dysfunction, unspecified: Secondary | ICD-10-CM

## 2014-04-02 NOTE — Telephone Encounter (Signed)
[facility] sent req for RF of viagra. Pended.

## 2014-04-03 MED ORDER — SILDENAFIL CITRATE 100 MG PO TABS: 50.0000 mg | ORAL_TABLET | Freq: Every day | ORAL | Status: DC | PRN

## 2014-04-14 ENCOUNTER — Encounter: Payer: Medicare Other | Admitting: Emergency Medicine

## 2014-06-02 ENCOUNTER — Ambulatory Visit (INDEPENDENT_AMBULATORY_CARE_PROVIDER_SITE_OTHER): Payer: Medicare Other | Admitting: Emergency Medicine

## 2014-06-02 ENCOUNTER — Encounter: Payer: Self-pay | Admitting: Emergency Medicine

## 2014-06-02 VITALS — BP 159/85 | HR 80 | Temp 97.9°F | Resp 16 | Ht 73.5 in | Wt 363.0 lb

## 2014-06-02 DIAGNOSIS — I10 Essential (primary) hypertension: Secondary | ICD-10-CM

## 2014-06-02 DIAGNOSIS — I482 Chronic atrial fibrillation, unspecified: Secondary | ICD-10-CM

## 2014-06-02 DIAGNOSIS — Z131 Encounter for screening for diabetes mellitus: Secondary | ICD-10-CM

## 2014-06-02 DIAGNOSIS — Z0000 Encounter for general adult medical examination without abnormal findings: Secondary | ICD-10-CM

## 2014-06-02 DIAGNOSIS — Z125 Encounter for screening for malignant neoplasm of prostate: Secondary | ICD-10-CM

## 2014-06-02 LAB — POCT URINALYSIS DIPSTICK
BILIRUBIN UA: NEGATIVE
Glucose, UA: NEGATIVE
KETONES UA: NEGATIVE
LEUKOCYTES UA: NEGATIVE
NITRITE UA: NEGATIVE
PH UA: 5.5
Protein, UA: NEGATIVE
RBC UA: NEGATIVE
Spec Grav, UA: 1.025
Urobilinogen, UA: 0.2

## 2014-06-02 NOTE — Patient Instructions (Addendum)
Your exam was normal today. Keep up the good work with the weight loss.  Please keep a log of your blood pressure readings at home. Please bring this with you to your next appointment since it was elevated today. We drew labs today and will let you know the results of those.  For the swelling in your left leg, be sure to stay active, elevate the leg when you're not using it, and look into buying some support hose to wear for a while each day.  We'll plan to see you back in 6 months for follow up.   Health Maintenance A healthy lifestyle and preventative care can promote health and wellness.  Maintain regular health, dental, and eye exams.  Eat a healthy diet. Foods like vegetables, fruits, whole grains, low-fat dairy products, and lean protein foods contain the nutrients you need and are low in calories. Decrease your intake of foods high in solid fats, added sugars, and salt. Get information about a proper diet from your health care provider, if necessary.  Regular physical exercise is one of the most important things you can do for your health. Most adults should get at least 150 minutes of moderate-intensity exercise (any activity that increases your heart rate and causes you to sweat) each week. In addition, most adults need muscle-strengthening exercises on 2 or more days a week.   Maintain a healthy weight. The body mass index (BMI) is a screening tool to identify possible weight problems. It provides an estimate of body fat based on height and weight. Your health care provider can find your BMI and can help you achieve or maintain a healthy weight. For males 20 years and older:  A BMI below 18.5 is considered underweight.  A BMI of 18.5 to 24.9 is normal.  A BMI of 25 to 29.9 is considered overweight.  A BMI of 30 and above is considered obese.  Maintain normal blood lipids and cholesterol by exercising and minimizing your intake of saturated fat. Eat a balanced diet with plenty of  fruits and vegetables. Blood tests for lipids and cholesterol should begin at age 36 and be repeated every 5 years. If your lipid or cholesterol levels are high, you are over age 23, or you are at high risk for heart disease, you may need your cholesterol levels checked more frequently.Ongoing high lipid and cholesterol levels should be treated with medicines if diet and exercise are not working.  If you smoke, find out from your health care provider how to quit. If you do not use tobacco, do not start.  Lung cancer screening is recommended for adults aged 30-80 years who are at high risk for developing lung cancer because of a history of smoking. A yearly low-dose CT scan of the lungs is recommended for people who have at least a 30-pack-year history of smoking and are current smokers or have quit within the past 15 years. A pack year of smoking is smoking an average of 1 pack of cigarettes a day for 1 year (for example, a 30-pack-year history of smoking could mean smoking 1 pack a day for 30 years or 2 packs a day for 15 years). Yearly screening should continue until the smoker has stopped smoking for at least 15 years. Yearly screening should be stopped for people who develop a health problem that would prevent them from having lung cancer treatment.  If you choose to drink alcohol, do not have more than 2 drinks per day. One drink is  considered to be 12 oz (360 mL) of beer, 5 oz (150 mL) of wine, or 1.5 oz (45 mL) of liquor.  Avoid the use of street drugs. Do not share needles with anyone. Ask for help if you need support or instructions about stopping the use of drugs.  High blood pressure causes heart disease and increases the risk of stroke. Blood pressure should be checked at least every 1-2 years. Ongoing high blood pressure should be treated with medicines if weight loss and exercise are not effective.  If you are 71-21 years old, ask your health care provider if you should take aspirin to  prevent heart disease.  Diabetes screening involves taking a blood sample to check your fasting blood sugar level. This should be done once every 3 years after age 64 if you are at a normal weight and without risk factors for diabetes. Testing should be considered at a younger age or be carried out more frequently if you are overweight and have at least 1 risk factor for diabetes.  Colorectal cancer can be detected and often prevented. Most routine colorectal cancer screening begins at the age of 108 and continues through age 53. However, your health care provider may recommend screening at an earlier age if you have risk factors for colon cancer. On a yearly basis, your health care provider may provide home test kits to check for hidden blood in the stool. A small camera at the end of a tube may be used to directly examine the colon (sigmoidoscopy or colonoscopy) to detect the earliest forms of colorectal cancer. Talk to your health care provider about this at age 60 when routine screening begins. A direct exam of the colon should be repeated every 5-10 years through age 2, unless early forms of precancerous polyps or small growths are found.  People who are at an increased risk for hepatitis B should be screened for this virus. You are considered at high risk for hepatitis B if:  You were born in a country where hepatitis B occurs often. Talk with your health care provider about which countries are considered high risk.  Your parents were born in a high-risk country and you have not received a shot to protect against hepatitis B (hepatitis B vaccine).  You have HIV or AIDS.  You use needles to inject street drugs.  You live with, or have sex with, someone who has hepatitis B.  You are a man who has sex with other men (MSM).  You get hemodialysis treatment.  You take certain medicines for conditions like cancer, organ transplantation, and autoimmune conditions.  Hepatitis C blood testing is  recommended for all people born from [birth year] through 1964 and any individual with known risk factors for hepatitis C.  Healthy men should no longer receive prostate-specific antigen (PSA) blood tests as part of routine cancer screening. Talk to your health care provider about prostate cancer screening.  Testicular cancer screening is not recommended for adolescents or adult males who have no symptoms. Screening includes self-exam, a health care provider exam, and other screening tests. Consult with your health care provider about any symptoms you have or any concerns you have about testicular cancer.  Practice safe sex. Use condoms and avoid high-risk sexual practices to reduce the spread of sexually transmitted infections (STIs).  You should be screened for STIs, including gonorrhea and chlamydia if:  You are sexually active and are younger than 24 years.  You are older than 24 years, and your  health care provider tells you that you are at risk for this type of infection.  Your sexual activity has changed since you were last screened, and you are at an increased risk for chlamydia or gonorrhea. Ask your health care provider if you are at risk.  If you are at risk of being infected with HIV, it is recommended that you take a prescription medicine daily to prevent HIV infection. This is called pre-exposure prophylaxis (PrEP). You are considered at risk if:  You are a man who has sex with other men (MSM).  You are a heterosexual man who is sexually active with multiple partners.  You take drugs by injection.  You are sexually active with a partner who has HIV.  Talk with your health care provider about whether you are at high risk of being infected with HIV. If you choose to begin PrEP, you should first be tested for HIV. You should then be tested every 3 months for as long as you are taking PrEP.  Use sunscreen. Apply sunscreen liberally and repeatedly throughout the day. You should seek  shade when your shadow is shorter than you. Protect yourself by wearing long sleeves, pants, a wide-brimmed hat, and sunglasses year round whenever you are outdoors.  Tell your health care provider of new moles or changes in moles, especially if there is a change in shape or color. Also, tell your health care provider if a mole is larger than the size of a pencil eraser.  A one-time screening for abdominal aortic aneurysm (AAA) and surgical repair of large AAAs by ultrasound is recommended for men aged 39-75 years who are current or former smokers.  Stay current with your vaccines (immunizations). Document Released: 11/11/2007 Document Revised: 05/20/2013 Document Reviewed: 10/10/2010 [facility] Patient Information 2015 [place], [place]. This information is not intended to replace advice given to you by your health care provider. Make sure you discuss any questions you have with your health care provider.

## 2014-06-02 NOTE — Progress Notes (Signed)
Subjective:    Patient ID: Stephen Francis, male    DOB: August 11, 1945, 69 y.o.   MRN: [id number]  PCP: Jenny Reichmann, MD  Chief Complaint  Patient presents with  . Annual Exam   Patient Active Problem List   Diagnosis Date Noted  . Impotence 04/08/2013  . Morbid obesity   . Hyperlipidemia   . Hyperglycemia 04/09/2012  . Atrial fibrillation 04/09/2012  . Rosacea 04/09/2012  . Kidney stones 04/09/2012   Prior to Admission medications   Medication Sig Start Date End Date Taking? Authorizing Provider  Cholecalciferol (VITAMIN D-3) 1000 UNITS CAPS Take 2,000 Units by mouth.   Yes Historical Provider, MD  diltiazem (CARDIZEM CD) 180 MG 24 hr capsule Take 1 capsule (180 mg total) by mouth daily. 10/28/13  Yes Darlyne Russian, MD  fish oil-omega-3 fatty acids 1000 MG capsule Take 1 g by mouth 2 (two) times daily.    Yes Historical Provider, MD  metoprolol (LOPRESSOR) 50 MG tablet Take 1 tablet (50 mg total) by mouth 2 (two) times daily. Needs office visit/CPE 09/22/12  Yes Collene Leyden, PA-C  Multiple Vitamin (MULTIVITAMIN) tablet Take 1 tablet by mouth daily.   Yes Historical Provider, MD  niacin 500 MG tablet Take 500 mg by mouth 2 (two) times daily with a meal.   Yes Historical Provider, MD  rivaroxaban (XARELTO) 20 MG TABS tablet Take 1 tablet (20 mg total) by mouth daily with supper. 10/24/13  Yes Jacolyn Reedy, MD  sildenafil (VIAGRA) 100 MG tablet Take 0.5-1 tablets (50-100 mg total) by mouth daily as needed for erectile dysfunction. 04/03/14  Yes Darlyne Russian, MD   Medications, allergies, past medical history, surgical history, family history, social history and problem list reviewed and updated.  HPI  72 yom with pmh paroxysmal afib, htn, obesity presents for cpe.  Has been doing well. Was seen by [place] 5/15, in afib with rvr, sent to ed. Put on xarelto, cardizem, continued on metoprolol. Denies any palps, dizziness, syncope, cp, or unusual sob since this event.  He was seen  here in 6/15 with allergies and cough. This has since resolved. BP elevated at that visit.   Seen here 9/15 as f/u for afib. BP was well controlled. Continued medication regimen.   Today he states he has been doing well. Taking all his meds as prescribed. He is down 21# from 6 months ago. He walks a little bit but is not currently exercising. Plays with grandson some. Takes his BP at home approx once week, runs 1202/70s. Elevated in clinic today.   He saw dermatology twice last month and had a "cyst" removed from left armpit. This is healed.   He states he is due for colonoscopy later this year. Had one 5 years ago, had a polyp and was told 5 years to come back. He is planning to set this up on his own. Sees Dr Carlean Purl.   Review of Systems No CP, SOB, fever, chills, HA, vision changes, dizziness, diarrhea, constipation.     Objective:   Physical Exam  Constitutional: He is oriented to person, place, and time. He appears well-developed and well-nourished.  Non-toxic appearance. He does not have a sickly appearance. He does not appear ill. No distress.  BP 159/85 mmHg  Pulse 80  Temp(Src) 97.9 F (36.6 C)  Resp 16  Ht 6' 1.5" (1.867 m)  Wt 363 lb (164.656 kg)  BMI 47.24 kg/m2  SpO2 98%   HENT:  Right  Ear: Tympanic membrane normal.  Left Ear: Tympanic membrane normal.  Nose: No mucosal edema or rhinorrhea.  Mouth/Throat: Uvula is midline, oropharynx is clear and moist and mucous membranes are normal. No oropharyngeal exudate, posterior oropharyngeal edema, posterior oropharyngeal erythema or tonsillar abscesses.  Eyes: Conjunctivae and EOM are normal. Pupils are equal, round, and reactive to light.  Neck: Normal range of motion. Carotid bruit is not present.  Cardiovascular: Normal rate, regular rhythm and normal heart sounds.  Exam reveals no gallop.   No murmur heard. Pulses:      Dorsalis pedis pulses are 2+ on the right side, and 2+ on the left side.  1+ pitting edema up to mid  shin left leg. No venous stasis skin changes.   Pulmonary/Chest: Effort normal and breath sounds normal. He has no decreased breath sounds. He has no wheezes. He has no rhonchi. He has no rales.  Rhonchi LUL which cleared with cough.   Lymphadenopathy:       Head (right side): No submental, no submandibular and no tonsillar adenopathy present.       Head (left side): No submental, no submandibular and no tonsillar adenopathy present.    He has no cervical adenopathy.  Neurological: He is alert and oriented to person, place, and time. He has normal strength. No cranial nerve deficit or sensory deficit.  Reflex Scores:      Patellar reflexes are 2+ on the right side and 2+ on the left side. Psychiatric: He has a normal mood and affect. His speech is normal.      Assessment & Plan:   89 yom with pmh paroxysmal afib, htn, obesity presents for cpe.  Medicare annual wellness visit, subsequent - Plan: POCT urinalysis dipstick --normal exam today, normal vitals other than mildly elevated bp --most labwork done in past year --up to date on immunizations --up to date on preventative screenings, he is planning to schedule his colonoscopy later this year  Chronic atrial fibrillation  --converted on his own 6 months ago --on xarelto, cardizem, metoprolol --no sx of cp, palps, sob, dizziness, syncope --follows with cardiology  Screening for prostate cancer --PSA drawn today  Essential hypertension --elevated in clinic today --continue to monitor at home, record readings, bring to next appt --Left LE edema today, elevate, support hose, stay active  Julieta Gutting, PA-C Physician Assistant-Certified [facility]  06/02/2014 9:25 AM

## 2014-06-03 LAB — PSA: PSA: 3.38 ng/mL (ref <=4.00)

## 2014-06-06 ENCOUNTER — Telehealth: Payer: Self-pay

## 2014-06-06 NOTE — Telephone Encounter (Signed)
Pt called back saying he had missed his call. He thinks it was for his lab results. Please advise at [phone]

## 2014-06-07 NOTE — Telephone Encounter (Signed)
See labs 

## 2014-06-07 NOTE — Telephone Encounter (Signed)
Pt.notified

## 2014-09-07 ENCOUNTER — Encounter (HOSPITAL_COMMUNITY): Payer: Self-pay | Admitting: Cardiology

## 2014-09-07 ENCOUNTER — Inpatient Hospital Stay (HOSPITAL_COMMUNITY)
Admission: AD | Admit: 2014-09-07 | Discharge: 2014-09-10 | DRG: 309 | Disposition: A | Discharge status: Home / Self Care | Payer: Medicare Other | Source: Ambulatory Visit | Attending: Cardiology | Admitting: Cardiology

## 2014-09-07 DIAGNOSIS — I480 Paroxysmal atrial fibrillation: Secondary | ICD-10-CM | POA: Diagnosis present

## 2014-09-07 DIAGNOSIS — I10 Essential (primary) hypertension: Secondary | ICD-10-CM | POA: Diagnosis present

## 2014-09-07 DIAGNOSIS — Z6841 Body Mass Index (BMI) 40.0 and over, adult: Secondary | ICD-10-CM

## 2014-09-07 DIAGNOSIS — I481 Persistent atrial fibrillation: Principal | ICD-10-CM | POA: Diagnosis present

## 2014-09-07 DIAGNOSIS — E119 Type 2 diabetes mellitus without complications: Secondary | ICD-10-CM | POA: Diagnosis present

## 2014-09-07 DIAGNOSIS — E7439 Other disorders of intestinal carbohydrate absorption: Secondary | ICD-10-CM | POA: Diagnosis present

## 2014-09-07 DIAGNOSIS — E785 Hyperlipidemia, unspecified: Secondary | ICD-10-CM | POA: Diagnosis present

## 2014-09-07 DIAGNOSIS — R5383 Other fatigue: Secondary | ICD-10-CM | POA: Diagnosis present

## 2014-09-07 DIAGNOSIS — Z79899 Other long term (current) drug therapy: Secondary | ICD-10-CM

## 2014-09-07 DIAGNOSIS — Z7901 Long term (current) use of anticoagulants: Secondary | ICD-10-CM

## 2014-09-07 LAB — BASIC METABOLIC PANEL
Anion gap: 9 (ref 5–15)
Anion gap: 9 (ref 5–15)
BUN: 8 mg/dL (ref 6–23)
BUN: 10 mg/dL (ref 6–23)
CHLORIDE: 101 mmol/L (ref 96–112)
CO2: 24 mmol/L (ref 19–32)
CO2: 27 mmol/L (ref 19–32)
Calcium: 9.5 mg/dL (ref 8.4–10.5)
Calcium: 9.2 mg/dL (ref 8.4–10.5)
Chloride: 106 mmol/L (ref 96–112)
Creatinine, Ser: 0.91 mg/dL (ref 0.50–1.35)
Creatinine, Ser: 1.02 mg/dL (ref 0.50–1.35)
GFR calc Af Amer: >90 mL/min (ref >90)
GFR calc Af Amer: 86 mL/min — ABNORMAL LOW (ref >90)
GFR calc non Af Amer: 86 mL/min — ABNORMAL LOW (ref >90)
GFR calc non Af Amer: 74 mL/min — ABNORMAL LOW (ref >90)
GLUCOSE: 123 mg/dL — AB (ref 70–99)
Glucose, Bld: 103 mg/dL — ABNORMAL HIGH (ref 70–99)
POTASSIUM: 4.3 mmol/L (ref 3.5–5.1)
Potassium: 3.8 mmol/L (ref 3.5–5.1)
SODIUM: 137 mmol/L (ref 135–145)
Sodium: 139 mmol/L (ref 135–145)

## 2014-09-07 LAB — MAGNESIUM: Magnesium: 2 mg/dL (ref 1.5–2.5)

## 2014-09-07 MED ORDER — SODIUM CHLORIDE 0.9 % IJ SOLN: 3.0000 mL | INTRAMUSCULAR | Status: DC | PRN

## 2014-09-07 MED ORDER — DILTIAZEM HCL ER COATED BEADS 180 MG PO CP24
180.0000 mg | ORAL_CAPSULE | Freq: Every day | ORAL | Status: DC
  Administered 2014-09-07: 180 mg via ORAL
  Filled 2014-09-07: qty 1

## 2014-09-07 MED ORDER — POTASSIUM CHLORIDE 20 MEQ/15ML (10%) PO SOLN
40.0000 meq | Freq: Once | ORAL | Status: AC
  Administered 2014-09-07: 40 meq via ORAL
  Filled 2014-09-07: qty 30

## 2014-09-07 MED ORDER — METOPROLOL TARTRATE 50 MG PO TABS
50.0000 mg | ORAL_TABLET | Freq: Two times a day (BID) | ORAL | Status: DC
  Administered 2014-09-07 – 2014-09-10 (×7): 50 mg via ORAL
  Filled 2014-09-07 (×8): qty 1

## 2014-09-07 MED ORDER — VITAMIN D3 25 MCG (1000 UNIT) PO TABS
2000.0000 [IU] | ORAL_TABLET | Freq: Every day | ORAL | Status: DC
  Filled 2014-09-07 (×4): qty 2

## 2014-09-07 MED ORDER — VITAMIN D-3 25 MCG (1000 UT) PO CAPS: 2000.0000 [IU] | ORAL_CAPSULE | Freq: Every day | ORAL | Status: DC

## 2014-09-07 MED ORDER — NIACIN 500 MG PO TABS
500.0000 mg | ORAL_TABLET | Freq: Two times a day (BID) | ORAL | Status: DC
  Administered 2014-09-07: 500 mg via ORAL
  Filled 2014-09-07 (×8): qty 1

## 2014-09-07 MED ORDER — SODIUM CHLORIDE 0.9 % IV SOLN: 250.0000 mL | INTRAVENOUS | Status: DC | PRN

## 2014-09-07 MED ORDER — DOFETILIDE 500 MCG PO CAPS
500.0000 ug | ORAL_CAPSULE | Freq: Two times a day (BID) | ORAL | Status: DC
  Filled 2014-09-07 (×2): qty 1

## 2014-09-07 MED ORDER — DILTIAZEM HCL ER COATED BEADS 180 MG PO CP24
180.0000 mg | ORAL_CAPSULE | Freq: Two times a day (BID) | ORAL | Status: DC
  Administered 2014-09-07 – 2014-09-10 (×6): 180 mg via ORAL
  Filled 2014-09-07 (×8): qty 1

## 2014-09-07 MED ORDER — SODIUM CHLORIDE 0.9 % IJ SOLN
3.0000 mL | Freq: Two times a day (BID) | INTRAMUSCULAR | Status: DC
  Administered 2014-09-08 – 2014-09-10 (×3): 3 mL via INTRAVENOUS

## 2014-09-07 MED ORDER — RIVAROXABAN 20 MG PO TABS
20.0000 mg | ORAL_TABLET | Freq: Every day | ORAL | Status: DC
  Administered 2014-09-07 – 2014-09-09 (×3): 20 mg via ORAL
  Filled 2014-09-07 (×4): qty 1

## 2014-09-07 MED ORDER — DOFETILIDE 500 MCG PO CAPS
500.0000 ug | ORAL_CAPSULE | Freq: Two times a day (BID) | ORAL | Status: DC
  Administered 2014-09-07 – 2014-09-08 (×2): 500 ug via ORAL
  Filled 2014-09-07 (×4): qty 1

## 2014-09-07 NOTE — Progress Notes (Signed)
Utilization review completed.  

## 2014-09-07 NOTE — H&P (Signed)
Stephen Francis  Date of visit:  4/11//2016 DOB:  1945/10/10    Age:  69 yrs. Medical record number:  [id number]     Account number:  [id number] Primary Care Provider: [name] A ____________________________ CURRENT DIAGNOSES  1. Persistent atrial fibrillation  2. Type 2 diabetes mellitus without complications  3. Morbid (severe) obesity  4. Essential (primary) hypertension  5. Long term (current) use of anticoagulants ____________________________ ALLERGIES  No Known Allergies ____________________________ MEDICATIONS  1. Multi-Day tablet, 1 p.o. daily  2. Fish Oil 1,000 mg Capsule, 1 p.o. daily  3. niacin 500 mg tablet, BID  4. Viagra 100 mg tablet, PRN  5. Vitamin D3 1,000 unit tablet, 2 qd  6. Xarelto 20 mg tablet, 1 p.o. daily  7. metoprolol tartrate 50 mg tablet, BID  8. diltiazem ER 180 mg capsule,extended release, BID ____________________________ CHIEF COMPLAINTS  Followup of Paroxysmal atrial fibrillation ____________________________ HISTORY OF PRESENT ILLNESS  Patient has had paroxsymal atrial fibrillation and then became persistent back in March. This has been associated with fatigue and lack of energy. He had an echocardiogram showing concentric LVH and only mild/moderate left atrial enlargement. He continues to be fatigued. He has had no bleeding complications from anticoagulation. He denies angina but does have some reduction in his exercise capacity. He has felt further about atrial fibrillation and is irregular today and is clinically in atrial fibrillation. He denies PND, orthopnea or claudication ____________________________ PAST HISTORY  Past Medical Illnesses:  morbid obesity, glucose intolerance, nephrolithiasis, hyperlipidemia;  Cardiovascular Illnesses:  atrial fibrillation-paroxysmal;  Infectious Diseases:  no previous history of significant infectious diseases;  Surgical Procedures:  tonsillectomy;  Trauma History:  no previous history of significant trauma;   NYHA Classification:  I;  Canadian Angina Classification:  Class 0: Asymptomatic;  Cardiology Procedures-Invasive:  no previous interventional or invasive cardiology procedures;  Cardiology Procedures-Noninvasive:  chest CT December 2009, echocardiogram December 2009, echocardiogram March 2016;  LVEF of 60% documented via echocardiogram on 08/11/2014,   ____________________________ CARDIO-PULMONARY TEST DATES EKG Date:  08/11/2014;  Holter/Event Monitor Date: 05/12/2008;  Echocardiography Date: 08/17/2014;  Chest Xray Date: 10/23/2013;  CT Scan Date:  04/30/2008   ____________________________ FAMILY HISTORY Brother -- Brother dead, Death of unknown cause Father -- Father dead, Death of unknown cause Mother -- Mother dead, Death of unknown cause Sister -- Sister alive and well ____________________________ SOCIAL HISTORY Alcohol Use:  does not use alcohol;  Smoking:  never smoked;  Diet:  regular diet;  Lifestyle:  married and 1 son;  Exercise:  no regular exercise;  Occupation:  repaired, Equities trader;  Residence:  lives with wife;   ____________________________ REVIEW OF SYSTEMS General:  longstanding obesity  Integumentary:rosacea, rash Eyes: denies diplopia, history of glaucoma or visual problems. Ears, Nose, Throat, Mouth:  partial hearing loss right ear Respiratory: denies dyspnea, cough, wheezing or hemoptysis. Cardiovascular:  please review HPI  Genitourinary-Male: no dysuria, urgency, frequency, or nocturia  Musculoskeletal:  arthritis of the right knee, arthritis of the left shoulder Neurological:  dizziness  ____________________________ PHYSICAL EXAMINATION VITAL SIGNS  Blood Pressure:  114/70 Sitting, Left arm, large cuff  , 118/72 Standing, Left arm and large cuffBMI: 0  Constitutional:  pleasant white male in no acute distress, severely obese Skin:  rosacea face Head:  normocephalic, normal hair pattern, no masses or tenderness Neck:  supple, without massess. No JVD,  thyromegaly or carotid bruits. Carotid upstroke normal. Chest:  normal symmetry, clear to auscultation. Cardiac:  irregularly irregular rhythm, normal S1 and  S2, no S3 or S4, no murmurs,clicks, or rub heard Peripheral Pulses:  the femoral,dorsalis pedis, and posterior tibial pulses are full and equal bilaterally with no bruits auscultated. Extremities & Back:  no deformities, clubbing, cyanosis, erythema or edema observed. Normal muscle strength and tone. Neurological:  no gross motor or sensory deficits noted, affect appropriate, oriented x3. ____________________________ MOST RECENT LIPID PANEL 04/08/13  CHOL TOTL 182 mg/dl, LDL 114 NM, HDL 26 mg/dl, TRIGLYCER 210 mg/dl and CHOL/HDL 7.0 (Calc) ____________________________ IMPRESSIONS/PLAN  1. Paroxysmal atrial fibrillation which is now persistent 2. Hypertensive heart disease 3. History of coronary artery disease as manifested by coronary calcifications 4. Obesity with need to lose weight 5. Long term anticoagulant use  Recommendations:  After extensive discussion with patient including risks and alternatives have opted to admit for initiation of Tikosyn to see if  can convert him to sinus rhythm and if not consider cardioversion at this time. Have discussed fully risks and benefits of Tikosyn including possibility of relapse of atrial fibrillation and he is willing to proceed. Planned to go into the hospital on April 11. ____________________________ TODAYS ORDERS  1. Admit to Hospital: April 11                       ____________________________ Cardiology Physician:  Kerry Hough MD [facility]

## 2014-09-07 NOTE — Progress Notes (Signed)
Pt does not want to take any over the counter vitamins while in the hospital this admission unless prescription.  Pt given niacin which resulted in a "niacin flush".  He stated this occurred before when it was prescribed and he started taking over the counter.  Pt will no longer take this admission. First tikosyn dose given and pt aware that EKG will be taken 2-3 hours later. Pt resting with call bell within reach.  Will continue to monitor. Payton Emerald, RN

## 2014-09-07 NOTE — Progress Notes (Addendum)
Pharmacy Consult for Dofetilide (Tikosyn) Initiation  Admit Complaint: 69 y.o. male admitted 09/07/2014 with atrial fibrillation to be initiated on dofetilide.   Assessment:  Patient Exclusion Criteria: If any screening criteria checked as "Yes", then  patient  should NOT receive dofetilide until criteria item is corrected. If "Yes" please indicate correction plan.  YES  NO Patient  Exclusion Criteria Correction Plan  [x]  []  Baseline QTc interval is greater than or equal to 440 msec. IF above YES box checked dofetilide contraindicated unless patient has ICD; then may proceed if QTc 500-550 msec or with known ventricular conduction abnormalities may proceed with QTc 550-600 msec. QTc = 455 Recheck tonight  []  [x]  Magnesium level is less than 1.8 mEq/l : Last magnesium:  Lab Results  Component Value Date   MG 2.0 09/07/2014         [x]  []  Potassium level is less than 4 mEq/l : Last potassium:  Lab Results  Component Value Date   K 3.8 09/07/2014       Replacement K+ ordered by cardiology  1600 Bmet ordered  []  [x]  Patient is known or suspected to have a digoxin level greater than 2 ng/ml: No results found for: DIGOXIN    []  [x]  Creatinine clearance less than 20 ml/min (calculated using Cockcroft-Gault, actual body weight and serum creatinine): Estimated Creatinine Clearance: 128.8 mL/min (by C-G formula based on Cr of 0.91).    []  [x]  Patient has received drugs known to prolong the QT intervals within the last 48 hours(phenothiazines, tricyclics or tetracyclic antidepressants, erythromycin, H-1 antihistamines, cisapride, fluoroquinolones, azithromycin). Drugs not listed above may have an, as yet, undetected potential to prolong the QT interval, updated information on QT prolonging agents is available at this website:QT prolonging agents   []  [x]  Patient received a dose of hydrochlorothiazide (Oretic) alone or in any combination including triamterene (Dyazide, Maxzide) in the last 48  hours.   []  [x]  Patient received a medication known to increase dofetilide plasma concentrations prior to initial dofetilide dose:  . Trimethoprim (Primsol, Proloprim) in the last 36 hours . Verapamil (Calan, Verelan) in the last 36 hours or a sustained release dose in the last 72 hours . Megestrol (Megace) in the last 5 days  . Cimetidine (Tagamet) in the last 6 hours . Ketoconazole (Nizoral) in the last 24 hours . Itraconazole (Sporanox) in the last 48 hours  . Prochlorperazine (Compazine) in the last 36 hours    []  [x]  Patient is known to have a history of torsades de pointes; congenital or acquired long QT syndromes.   []  [x]  Patient has received a Class 1 antiarrhythmic with less than 2 half-lives since last dose. (Disopyramide, Quinidine, Procainamide, Lidocaine, Mexiletine, Flecainide, Propafenone)   []  [x]  Patient has received amiodarone therapy in the past 3 months or amiodarone level is greater than 0.3 ng/ml.    Patient has been appropriately anticoagulated with xarelto.  Ordering provider was confirmed at [email or web address] if they are not listed on the [place] Prescribers list.  Goal of Therapy: Follow renal function, electrolytes, potential drug interactions, and dose adjustment. Provide education and 1 week supply at discharge.  Plan:  [x]   Physician selected initial dose within range recommended for patients level of renal function - will monitor for response.  []   Physician selected initial dose outside of range recommended for patients level of renal function - will discuss if the dose should be altered at this time.   Select One Calculated CrCl  Dose q12h  [x]  >  60 ml/min 500 mcg  []  40-60 ml/min 250 mcg  []  20-40 ml/min 125 mcg   2. Follow up QTc after the first 5 doses, renal function, electrolytes (K & Mg) daily x 3 days, dose adjustment, success of initiation and facilitate 1 week discharge supply as clinically indicated.  3. Initiate Tikosyn  education video (Call [phone] and ask for video # [phone]).  4. Place Enrollment Form on the chart for discharge supply of dofetilide.  Erin Hearing PharmD., BCPS Clinical Pharmacist Pager [phone] 09/07/2014 2:32 PM

## 2014-09-08 LAB — MAGNESIUM: MAGNESIUM: 2 mg/dL (ref 1.5–2.5)

## 2014-09-08 LAB — BASIC METABOLIC PANEL
Anion gap: 9 (ref 5–15)
BUN: 10 mg/dL (ref 6–23)
CALCIUM: 9.4 mg/dL (ref 8.4–10.5)
CHLORIDE: 103 mmol/L (ref 96–112)
CO2: 27 mmol/L (ref 19–32)
CREATININE: 0.89 mg/dL (ref 0.50–1.35)
GFR calc Af Amer: >90 mL/min (ref >90)
GFR calc non Af Amer: 87 mL/min — ABNORMAL LOW (ref >90)
GLUCOSE: 103 mg/dL — AB (ref 70–99)
Potassium: 4.3 mmol/L (ref 3.5–5.1)
Sodium: 139 mmol/L (ref 135–145)

## 2014-09-08 MED ORDER — DOFETILIDE 250 MCG PO CAPS
250.0000 ug | ORAL_CAPSULE | Freq: Two times a day (BID) | ORAL | Status: DC
  Administered 2014-09-08 – 2014-09-10 (×4): 250 ug via ORAL
  Filled 2014-09-08 (×8): qty 1

## 2014-09-08 MED ORDER — SODIUM CHLORIDE 0.45 % IV SOLN: INTRAVENOUS | Status: DC

## 2014-09-08 NOTE — Discharge Instructions (Signed)

## 2014-09-08 NOTE — Progress Notes (Signed)
Subjective:  Tolerating Tikosyn well so far without complaints.  No proarrhythmia.  Denies shortness of breath or chest pain.  Complains of significant exertional fatigue.  Objective:  Vital Signs in the last 24 hours: BP 107/59 mmHg  Pulse 63  Temp(Src) 97.6 F (36.4 C) (Oral)  Resp 18  Ht 6\' 2"  (1.88 m)  Wt 165.744 kg (365 lb 6.4 oz)  BMI 46.89 kg/m2  SpO2 95%  Physical Exam: Very large white male currently in no acute distress Lungs:  Clear  Cardiac:  Irregular rhythm, normal S1 and S2, no S3 Abdomen:  Soft, nontender, no masses Extremities:  No edema present  Intake/Output from previous day:   Weight Filed Weights   09/07/14 0823  Weight: 165.744 kg (365 lb 6.4 oz)    Lab Results: Basic Metabolic Panel:  Recent Labs  09/07/14 1633 09/08/14 0408  NA 137 139  K 4.3 4.3  CL 101 103  CO2 27 27  GLUCOSE 103* 103*  BUN 10 10  CREATININE 1.02 0.89   Telemetry: Continues in atrial fibrillation with mildly rapid response  Assessment/Plan:  1.  Persistent atrial fibrillation-currently Tikosyn loading 2.  Long-term anticoagulation without complications 3.  Essential hypertension currently controlled  Recommendations:  Plan cardioversion tomorrow if has not converted into sinus rhythm.  Cardioversion risk discussed with patient and he is agreeable and wanted to proceed.  Keep nothing by mouth after midnight.  QTC difficult to assess off of telemetry.  Will check 12-lead EKG now.      Kerry Hough  MD [facility]  09/08/2014, 8:42 AM

## 2014-09-08 NOTE — Progress Notes (Signed)
Patient converted to NSR at 1516; EKG completed to confirm; Dr. Wynonia Lawman made aware.  Rowe Pavy, RN

## 2014-09-08 NOTE — Progress Notes (Signed)
Notified MD of QTC of 515, MD ordered to have Tikosyn dose decreased to 250, notified MD of patient/family concerns of HR dropping to 40's after taking cardizem, no new orders from MD, will continue to monitor and assess.  Rowe Pavy, RN

## 2014-09-08 NOTE — Care Management Note (Signed)
    Page 1 of 1   09/10/2014     11:58:57 AM CARE MANAGEMENT NOTE 09/10/2014  Patient:  Stephen Francis, Stephen Francis   Account Number:  [id number]  Date Initiated:  09/08/2014  Documentation initiated by:  Marvetta Gibbons  Subjective/Objective Assessment:   Pt admitted with afib for Tikosyn load     Action/Plan:   PTA pt lived at home with spouse   Anticipated DC Date:  09/10/2014   Anticipated DC Plan:  [place]  CM consult  Medication Assistance      Choice offered to / List presented to:             Status of service:  Completed, signed off Medicare Important Message given?  YES (If response is "NO", the following Medicare IM given date fields will be blank) Date Medicare IM given:  09/10/2014 Medicare IM given by:  Marvetta Gibbons Date Additional Medicare IM given:   Additional Medicare IM given by:    Discharge Disposition:  HOME/SELF CARE  Per UR Regulation:  Reviewed for med. necessity/level of care/duration of stay  If discussed at [place] of Stay Meetings, dates discussed:    Comments:  09/10/14- 0900- Marvetta Gibbons RN, BSN [phone] Bedside RN with script for 14 day supply- pt for d/c today- script to be sent to [facility] they will call bedside RN when ready- pt to f/u with mail order for Tikosyn.  09/08/14- 1100- Marvetta Gibbons RN, BSN [phone] Referral for Tikosyn needs- spoke with pt and wife at bedside- per conversation pt uses mail order for his medications (expres scripts) per benefits check- Called [facility] at  [phone]. Talked to [facility]. TIKOSYN is covered. No Prior Authorization required. Clifton James stated that this medication only comes in the 0.125 dosage. Patient's retail pharmacy co-payment will be $117.00. Pt does state that he uses [facility] locally off of [address] if needed for short term meds- call made to [facility] but they are not approved to order Tikosyn- pt will need and prefers to use his mail  order- will need a 14 day supply from [place]- MD will need to write for 14 day script (no refills) at time of discharge along with refillable script for pt to send to his mail order pharmacy. NCM to continue to follow for any further assistance needed.

## 2014-09-09 ENCOUNTER — Encounter (HOSPITAL_COMMUNITY)
Admission: AD | Disposition: A | Discharge status: Home / Self Care | Payer: Self-pay | Source: Ambulatory Visit | Attending: Cardiology

## 2014-09-09 LAB — BASIC METABOLIC PANEL
Anion gap: 11 (ref 5–15)
BUN: 10 mg/dL (ref 6–23)
CALCIUM: 9.2 mg/dL (ref 8.4–10.5)
CO2: 28 mmol/L (ref 19–32)
Chloride: 101 mmol/L (ref 96–112)
Creatinine, Ser: 0.85 mg/dL (ref 0.50–1.35)
GFR calc Af Amer: >90 mL/min (ref >90)
GFR calc non Af Amer: 88 mL/min — ABNORMAL LOW (ref >90)
GLUCOSE: 102 mg/dL — AB (ref 70–99)
Potassium: 4.1 mmol/L (ref 3.5–5.1)
SODIUM: 140 mmol/L (ref 135–145)

## 2014-09-09 LAB — MAGNESIUM: MAGNESIUM: 2 mg/dL (ref 1.5–2.5)

## 2014-09-09 SURGERY — CARDIOVERSION
Anesthesia: Monitor Anesthesia Care

## 2014-09-09 NOTE — Plan of Care (Signed)
Problem: Phase II Progression Outcomes Goal: Discharge plan established Outcome: Completed/Met Date Met:  09/09/14 Discharge planned for 09/10/14

## 2014-09-09 NOTE — Progress Notes (Signed)
Subjective:  Converted to normal sinus rhythm yesterday afternoon on Tikosyn.  His Tikosyn dose was reduced because of a slightly prolonged QTC.  Last QTC last night in sinus rhythm was 0.48.  Feeling well without symptoms.  Notes improvement in his breathing.  No chest pain.  Objective:  Vital Signs in the last 24 hours: BP 115/61 mmHg  Pulse 66  Temp(Src) 97.7 F (36.5 C) (Oral)  Resp 18  Ht 6\' 2"  (1.88 m)  Wt 165.744 kg (365 lb 6.4 oz)  BMI 46.89 kg/m2  SpO2 99%  Physical Exam: Very large white male currently in no acute distress Lungs:  Clear  Cardiac: Regular rhythm, normal S1 and S2, no S3 Abdomen:  Soft, nontender, no masses Extremities:  No edema present  Intake/Output from previous day:   Weight Filed Weights   09/07/14 0823  Weight: 165.744 kg (365 lb 6.4 oz)    Lab Results: Basic Metabolic Panel:  Recent Labs  09/08/14 0408 09/09/14 0409  NA 139 140  K 4.3 4.1  CL 103 101  CO2 27 28  GLUCOSE 103* 102*  BUN 10 10  CREATININE 0.89 0.85   Telemetry: Normal sinus rhythm, QTC currently 0.48  Assessment/Plan:  1.  Persistent atrial fibrillation-with conversion to normal sinus rhythm  2.  Long-term anticoagulation without complications 3.  Essential hypertension currently controlled  Recommendations:  Continue Tikosyn as well as monitoring of QTC.  If things go well today plan discharge after dose in the morning.  Discussed importance of weight loss with patient to help cut down on his chance of recurrence of atrial fibrillation in the future.      Kerry Hough  MD [facility]  09/09/2014, 8:20 AM

## 2014-09-10 LAB — MAGNESIUM: MAGNESIUM: 2 mg/dL (ref 1.5–2.5)

## 2014-09-10 LAB — BASIC METABOLIC PANEL
ANION GAP: 11 (ref 5–15)
BUN: 10 mg/dL (ref 6–23)
CHLORIDE: 104 mmol/L (ref 96–112)
CO2: 24 mmol/L (ref 19–32)
Calcium: 9.2 mg/dL (ref 8.4–10.5)
Creatinine, Ser: 0.85 mg/dL (ref 0.50–1.35)
GFR calc Af Amer: >90 mL/min (ref >90)
GFR calc non Af Amer: 88 mL/min — ABNORMAL LOW (ref >90)
Glucose, Bld: 104 mg/dL — ABNORMAL HIGH (ref 70–99)
Potassium: 4.2 mmol/L (ref 3.5–5.1)
Sodium: 139 mmol/L (ref 135–145)

## 2014-09-10 MED ORDER — HYDROCORTISONE 1 % EX CREA
1.0000 "application " | TOPICAL_CREAM | Freq: Three times a day (TID) | CUTANEOUS | Status: DC | PRN
  Filled 2014-09-10: qty 28

## 2014-09-10 MED ORDER — DILTIAZEM HCL ER COATED BEADS 180 MG PO CP24: 180.0000 mg | ORAL_CAPSULE | Freq: Every day | ORAL | Status: DC

## 2014-09-10 MED ORDER — DOFETILIDE 250 MCG PO CAPS: 250.0000 ug | ORAL_CAPSULE | Freq: Two times a day (BID) | ORAL | Status: DC

## 2014-09-10 NOTE — Progress Notes (Signed)
Medicare Important Message given? YES  (If response is "NO", the following Medicare IM given date fields will be blank)  Date Medicare IM given: 09/10/14 Medicare IM given by:  Rosemond Lyttle  

## 2014-09-10 NOTE — Discharge Summary (Signed)
Physician Discharge Summary  Patient ID: Stephen Francis MRN: [id number] DOB/AGE: 07/27/1945 69 y.o.  Admit date: 09/07/2014 Discharge date: 09/10/2014  Primary Physician: Dr. [name]  Primary Discharge Diagnosis:  1.  Persistent atrial fibrillation with conversion to sinus rhythm after Tikosyn  Secondary Discharge Diagnosis: 2.  Morbid obesity 2.  Hypertension 3.  Long-term use of anticoagulants 4.  Glucose intolerance  Hospital Course: The patient was brought into the hospital to initiate Tikosyn for persistent atrial fibrillation that is symptomatic.  Laboratory work was done and his initial potassium was slightly low and he was given additional potassium.  Tikosyn was initiated and the patient had some mild QT prolongation on the initial doses and the dose was adjusted to 250 g.  He converted to sinus rhythm after the second dose of Tikosyn and was improved symptomatically.  He continued to be loaded with Tikosyn and had no proarrhythmia and remained in sinus rhythm.  He was given patient education regarding Tikosyn as well as precautions and his EKG, telemetry, and labs were monitored.  He was discharged home in improved condition with a 2 week supply and will have a mail-order prescription called in.  He is to follow-up in one week.  Discharge Exam: Blood pressure 113/62, pulse 74, temperature 97.5 F (36.4 C), temperature source Oral, resp. rate 18, height 6\' 2"  (1.88 m), weight 165.744 kg (365 lb 6.4 oz), SpO2 98 %. Weight: (!) 165.744 kg (365 lb 6.4 oz) Lungs clear, no S3  Labs: CBC:   Lab Results  Component Value Date   WBC 7.1 10/23/2013   HGB 16.4 10/23/2013   HCT 50.4 10/23/2013   MCV 91.0 10/23/2013   PLT 246 10/23/2013    CMP:  Recent Labs Lab 09/10/14 0522  NA 139  K 4.2  CL 104  CO2 24  BUN 10  CREATININE 0.85  CALCIUM 9.2  GLUCOSE 104*   Lipid Panel     Component Value Date/Time   CHOL 178 01/29/2014 1000   TRIG 190* 01/29/2014 1000   HDL  29* 01/29/2014 1000   CHOLHDL 6.1 01/29/2014 1000   VLDL 38 01/29/2014 1000   LDLCALC 111* 01/29/2014 1000   BNP (last 3 results)  Recent Labs  10/23/13 0921  PROBNP 589.5*   Protime: Invalid input(s): PT  Thyroid: Lab Results  Component Value Date   TSH 3.060 10/23/2013    Hemoglobin A1C: Lab Results  Component Value Date   HGBA1C 5.6 01/29/2014   EKG: Sinus rhythm with occasional PACs.  QTC is 0.46  Discharge Medications:   Medication List    TAKE these medications        diltiazem 180 MG 24 hr capsule  Commonly known as:  CARDIZEM CD  Take 1 capsule (180 mg total) by mouth daily.     dofetilide 250 MCG capsule  Commonly known as:  TIKOSYN  Take 1 capsule (250 mcg total) by mouth every 12 (twelve) hours.     fish oil-omega-3 fatty acids 1000 MG capsule  Take 1 g by mouth 2 (two) times daily.     metoprolol 50 MG tablet  Commonly known as:  LOPRESSOR  Take 1 tablet (50 mg total) by mouth 2 (two) times daily. Needs office visit/CPE     multivitamin tablet  Take 1 tablet by mouth daily.     niacin 500 MG tablet  Take 500 mg by mouth 2 (two) times daily with a meal.     rivaroxaban 20 MG Tabs tablet  Commonly known as:  XARELTO  Take 1 tablet (20 mg total) by mouth daily with supper.     sildenafil 100 MG tablet  Commonly known as:  VIAGRA  Take 0.5-1 tablets (50-100 mg total) by mouth daily as needed for erectile dysfunction.     Vitamin D-3 1000 UNITS Caps  Take 2,000 Units by mouth daily.       Followup plans and appointments: Follow-up Dr. Wynonia Lawman in one week  Time spent with patient to include physician time:  30 minutes   Signed: W. Doristine Church. MD [facility] 09/10/2014, 8:45 AM

## 2014-09-22 ENCOUNTER — Encounter: Payer: Self-pay | Admitting: Cardiology

## 2014-09-22 NOTE — Progress Notes (Signed)
Patient ID: GUTHRIE LEMME, male   DOB: 01/13/1946, 70 y.o.   MRN: [id number]   Fortunato, Nordin  Date of visit:  09/22/2014 DOB:  26-Sep-1945    Age:  69 yrs. Medical record number:  [id number]     Account number:  [id number] Primary Care Provider: [name] A ____________________________ CURRENT DIAGNOSES  1. Paroxysmal atrial fibrillation  2. Type 2 diabetes mellitus without complications  3. Morbid (severe) obesity  4. Essential (primary) hypertension  5. Long term (current) use of anticoagulants ____________________________ ALLERGIES  No Known Allergies ____________________________ MEDICATIONS  1. Multi-Day tablet, 1 p.o. daily  2. Fish Oil 1,000 mg Capsule, 1 p.o. daily  3. niacin 500 mg tablet, BID  4. Viagra 100 mg tablet, PRN  5. Vitamin D3 1,000 unit tablet, 2 qd  6. Xarelto 20 mg tablet, 1 p.o. daily  7. metoprolol tartrate 50 mg tablet, BID  8. diltiazem ER 180 mg capsule,extended release, BID  9. Tikosyn 250 mcg capsule, BID ____________________________ CHIEF COMPLAINTS  Followup of Paroxysmal atrial fibrillation ____________________________ HISTORY OF PRESENT ILLNESS Patient returns for cardiac followup. He was hospitalized to initiate Tikosyn. He converted to sinus rhythm after the second dose and remained in sinus rhythm on discharge. He feels that his exercise capacity has improved since he was in the hospital and he has less exertional dyspnea and feels that he can hold out to do work or walking a good bit longer than he was previously. He denies PND, orthopnea or edema. He has no bleeding complications from anticoagulation. He is able to tolerate Tikosyn well. ____________________________ PAST HISTORY  Past Medical Illnesses:  morbid obesity, glucose intolerance, nephrolithiasis, hyperlipidemia;  Cardiovascular Illnesses:  atrial fibrillation-paroxysmal;  Infectious Diseases:  no previous history of significant infectious diseases;  Surgical Procedures:   tonsillectomy;  Trauma History:  no previous history of significant trauma;  NYHA Classification:  I;  Canadian Angina Classification:  Class 0: Asymptomatic;  Cardiology Procedures-Invasive:  no previous interventional or invasive cardiology procedures;  Cardiology Procedures-Noninvasive:  chest CT December 2009, echocardiogram December 2009, echocardiogram March 2016;  LVEF of 60% documented via echocardiogram on 08/11/2014,   ____________________________ CARDIO-PULMONARY TEST DATES EKG Date:  08/11/2014;  Holter/Event Monitor Date: 05/12/2008;  Echocardiography Date: 08/17/2014;  Chest Xray Date: 10/23/2013;  CT Scan Date:  04/30/2008   ____________________________ FAMILY HISTORY Brother -- Brother dead, Death of unknown cause Father -- Father dead, Death of unknown cause Mother -- Mother dead, Death of unknown cause Sister -- Sister alive and well ____________________________ SOCIAL HISTORY Alcohol Use:  does not use alcohol;  Smoking:  never smoked;  Diet:  regular diet;  Lifestyle:  married and 1 son;  Exercise:  some exercise;  Occupation:  repaired, Equities trader;  Residence:  lives with wife;   ____________________________ REVIEW OF SYSTEMS General:  longstanding obesity  Integumentary:rosacea, rash Ears, Nose, Throat, Mouth:  partial hearing loss right ear Respiratory: denies dyspnea, cough, wheezing or hemoptysis. Cardiovascular:  please review HPI  Genitourinary-Male: no dysuria, urgency, frequency, or nocturia  Musculoskeletal:  arthritis of the right knee, arthritis of the left shoulder Neurological:  dizziness  ____________________________ PHYSICAL EXAMINATION VITAL SIGNS  Blood Pressure:  130/78 Standing, Right arm, large cuff  , 122/70 Sitting, Right arm and large cuff   Pulse:  80/min. Weight:  362.00 lbs. Height:  72"BMI: 49  Constitutional:  pleasant white male in no acute distress, severely obese Neck:  supple, without massess. No JVD, thyromegaly or carotid bruits.  Carotid upstroke normal. Chest:  normal symmetry, clear to auscultation. Peripheral Pulses:  the femoral,dorsalis pedis, and posterior tibial pulses are full and equal bilaterally with no bruits auscultated. Extremities & Back:  no deformities, clubbing, cyanosis, erythema or edema observed. Normal muscle strength and tone. Neurological:  no gross motor or sensory deficits noted, affect appropriate, oriented x3. ____________________________ MOST RECENT LIPID PANEL 01/29/14  CHOL TOTL 178 mg/dl, LDL 111 NM, HDL 29 mg/dl, TRIGLYCER 190 mg/dl and CHOL/HDL 6.1 (Calc) ____________________________ IMPRESSIONS/PLAN  1. Persistent atrial fibrillation currently maintaining sinus rhythm on EKG today 2. Severe morbid obesity with the need to lose weight again discussed 3. Long term use of anticoagulants without complication 4. Hypertension currently controlled  Recommendations:  Continue Tikosyn under monitoring. EKG shows sinus rhythm with no QT prolongation of significance. Followup in 3 months with repeat EKG. Call if recurrent problems. ____________________________ TODAYS ORDERS  1. 12 Lead EKG: Today  2. Return Visit: 3 months  3. 12 Lead EKG: 3 months                       ____________________________ Cardiology Physician:  Kerry Hough MD [facility]

## 2014-12-01 ENCOUNTER — Encounter: Payer: Self-pay | Admitting: Emergency Medicine

## 2014-12-01 ENCOUNTER — Ambulatory Visit (INDEPENDENT_AMBULATORY_CARE_PROVIDER_SITE_OTHER): Payer: Medicare Other | Admitting: Emergency Medicine

## 2014-12-01 VITALS — BP 138/84 | HR 91 | Temp 98.6°F | Resp 18 | Wt 365.0 lb

## 2014-12-01 DIAGNOSIS — E785 Hyperlipidemia, unspecified: Secondary | ICD-10-CM | POA: Diagnosis not present

## 2014-12-01 DIAGNOSIS — E781 Pure hyperglyceridemia: Secondary | ICD-10-CM

## 2014-12-01 DIAGNOSIS — Z23 Encounter for immunization: Secondary | ICD-10-CM

## 2014-12-01 DIAGNOSIS — R972 Elevated prostate specific antigen [PSA]: Secondary | ICD-10-CM

## 2014-12-01 DIAGNOSIS — R739 Hyperglycemia, unspecified: Secondary | ICD-10-CM

## 2014-12-01 LAB — LIPID PANEL
CHOL/HDL RATIO: 7.5 ratio
Cholesterol: 181 mg/dL (ref 0–200)
HDL: 24 mg/dL — ABNORMAL LOW (ref >=40)
LDL Cholesterol: 104 mg/dL — ABNORMAL HIGH (ref 0–99)
Triglycerides: 263 mg/dL — ABNORMAL HIGH (ref <150)
VLDL: 53 mg/dL — ABNORMAL HIGH (ref 0–40)

## 2014-12-01 LAB — MICROALBUMIN, URINE: Microalb, Ur: 2.5 mg/dL — ABNORMAL HIGH (ref <2.0)

## 2014-12-01 LAB — BASIC METABOLIC PANEL WITH GFR
BUN: 13 mg/dL (ref 6–23)
CO2: 26 mEq/L (ref 19–32)
Calcium: 9.4 mg/dL (ref 8.4–10.5)
Chloride: 104 mEq/L (ref 96–112)
Creat: 0.84 mg/dL (ref 0.50–1.35)
GFR, Est African American: >89 mL/min
GFR, Est Non African American: >89 mL/min
Glucose, Bld: 121 mg/dL — ABNORMAL HIGH (ref 70–99)
Potassium: 4.3 mEq/L (ref 3.5–5.3)
Sodium: 140 mEq/L (ref 135–145)

## 2014-12-01 LAB — HEMOGLOBIN A1C: Hgb A1c MFr Bld: 5.7 % (ref 4.0–6.0)

## 2014-12-01 LAB — POCT GLYCOSYLATED HEMOGLOBIN (HGB A1C): Hemoglobin A1C: 5.7

## 2014-12-01 LAB — GLUCOSE, POCT (MANUAL RESULT ENTRY): POC Glucose: 112 mg/dl — AB (ref 70–99)

## 2014-12-01 NOTE — Progress Notes (Addendum)
   Subjective:  This chart was scribed for Nena Jordan, MD by [facility], medical scribe at [facility].The patient was seen in exam room 21 and the patient's care was started at 8:05 AM.   Patient ID: Stephen Francis, male    DOB: Jun 14, 1945, 69 y.o.   MRN: [id number] Chief Complaint  Patient presents with  . bloodwork   HPI HPI Comments: Stephen Francis is a 69 y.o. male who presents to [facility] for a follow up and blood work.  Follow up for hospital visit: He was in the hospital in April for atrial fibrillation. He given two doses of Tikosyn which brought him back into regular rhythm. He is currently taking 250 mcg Tikosyn every 12 hours.  Sleep apnea: He does not have sleep apnea. He sleeps on his side.  Hyperglycemia: He has a history of hyperglycemia, his blood sugar have been at the upper limits of normal. He does not exercise due to a knee injury. He may need knee surgery He would like a referral to a nutritionist. He does see an eye doctor once a year.   Review of Systems  Musculoskeletal: Positive for arthralgias.      Objective:  BP 138/84 mmHg  Pulse 91  Temp(Src) 98.6 F (37 C) (Oral)  Resp 18  Wt 365 lb (165.563 kg)  SpO2 97% Physical Exam  Vitals reviewed. CONSTITUTIONAL: Well developed/well nourished, morbidly obese. HEAD: Normocephalic/atraumatic EYES: EOMI/PERRL ENMT: Mucous membranes moist NECK: supple no meningeal signs SPINE/BACK:entire spine nontender CV: S1/S2 noted, no murmurs/rubs/gallops noted, no carotid bruit  LUNGS: Lungs are clear to auscultation bilaterally, no apparent distress ABDOMEN: soft, nontender, no rebound or guarding, bowel sounds noted throughout abdomen GU:no cva tenderness NEURO: Pt is awake/alert/appropriate, moves all extremitiesx4.  No facial droop.   EXTREMITIES: pulses normal/equal, full ROM SKIN: warm, color normal, rosacea on his face PSYCH: no abnormalities of mood noted,  alert and oriented to situation.     Assessment & Plan:  1. Hyperglycemia Results for orders placed or performed in visit on 12/01/14  POCT glucose (manual entry)  Result Value Ref Range   POC Glucose 112 (A) 70 - 99 mg/dl  POCT glycosylated hemoglobin (Hb A1C)  Result Value Ref Range   Hemoglobin A1C 5.7    - POCT glucose (manual entry) - POCT glycosylated hemoglobin (Hb A1C) - Microalbumin, urine - BASIC METABOLIC PANEL WITH GFR  2. Hyperlipidemia He has high triglycerides but is intolerant to niacin  3. Morbid obesity Referral made to nutritionist - BASIC METABOLIC PANEL WITH GFR  4. Hypertriglyceridemia He can take a low-dose of niacin only - Lipid panel  5. Rising PSA level This has been slowly rising. 4 check another level today. He gets up 2-3 times at night to P however I am not comfortable giving him daily Cialis due to his cardiac problems. - PSA, Medicare  6. Immunization due Patient received Prevnar in September of last year. He received the pneumococcal vaccine 9 years ago so he is due for an update being over 65. - Pneumococcal polysaccharide vaccine 23-valent greater than or equal to 2yo subcutaneous/IM   I personally performed the services described in this documentation, which was scribed in my presence. The recorded information has been reviewed and is accurate.  [name], MD  [facility]  12/01/2014 8:50 AM

## 2014-12-02 ENCOUNTER — Telehealth: Payer: Self-pay | Admitting: *Deleted

## 2014-12-02 LAB — PSA, MEDICARE: PSA: 3.56 ng/mL (ref <=4.00)

## 2014-12-02 NOTE — Telephone Encounter (Signed)
Phoned patient to inquire about his eye doctor and he said his name was "he thought" was Dr. [name] - [facility].  Updated care team, and faxed request for most recent diabetic eye exam and upcoming date of OV.

## 2014-12-02 NOTE — Telephone Encounter (Signed)
Received 12/23/2013 diabetic eye exam - no diabetic retinopathy.  Is due for this year's this month, but per opth office - not scheduled yet.  Updated health maintenance, abstracted & sent for med rec & PCP review.

## 2014-12-03 ENCOUNTER — Encounter: Payer: Self-pay | Admitting: Cardiology

## 2014-12-03 DIAGNOSIS — E119 Type 2 diabetes mellitus without complications: Secondary | ICD-10-CM | POA: Insufficient documentation

## 2014-12-22 ENCOUNTER — Encounter: Payer: Self-pay | Admitting: Cardiology

## 2014-12-22 NOTE — Progress Notes (Signed)
Patient ID: Stephen Francis, male   DOB: 1945-12-13, 69 y.o.   MRN: [id number]   Stephen, Francis  Date of visit:  12/22/2014 DOB:  1946/05/22    Age:  69 yrs. Medical record number:  [id number]     Account number:  [id number] Primary Care Provider: [name] A ____________________________ CURRENT DIAGNOSES  1. Paroxysmal atrial fibrillation  2. Type 2 diabetes mellitus without complications  3. Morbid (severe) obesity  4. Essential (primary) hypertension  5. Long term (current) use of anticoagulants ____________________________ ALLERGIES  No Known Allergies ____________________________ MEDICATIONS  1. Multi-Day tablet, 1 p.o. daily  2. Fish Oil 1,000 mg Capsule, 1 p.o. daily  3. niacin 500 mg tablet, BID  4. Viagra 100 mg tablet, PRN  5. Vitamin D3 1,000 unit tablet, 2 qd  6. metoprolol tartrate 50 mg tablet, BID  7. diltiazem ER 180 mg capsule,extended release, BID  8. Tikosyn 250 mcg capsule, BID  9. Xarelto 20 mg tablet, 1 p.o. daily ____________________________ CHIEF COMPLAINTS  Followup of Paroxysmal atrial fibrillation ____________________________ HISTORY OF PRESENT ILLNESS Patient returns for followup of atrial fibrillation. He has had no recurrence of atrial fibrillation and is tolerating Tikosyn well without side effects. He has no bleeding complications from anticoagulation. Blood pressure is under reasonable control at this time. He continues to have a moderate amount of low back. He denies angina and has no PND, orthopnea, syncope, or claudication. ____________________________ PAST HISTORY  Past Medical Illnesses:  morbid obesity, glucose intolerance, nephrolithiasis, hyperlipidemia, interstitial cystitis, lumbar disc disease;  Cardiovascular Illnesses:  atrial fibrillation-paroxysmal;  Infectious Diseases:  no previous history of significant infectious diseases;  Surgical Procedures:  tonsillectomy;  Trauma History:  no previous history of significant trauma;  NYHA  Classification:  I;  Canadian Angina Classification:  Class 0: Asymptomatic;  Cardiology Procedures-Invasive:  no previous interventional or invasive cardiology procedures;  Cardiology Procedures-Noninvasive:  chest CT December 2009, echocardiogram December 2009, echocardiogram March 2016;  LVEF of 60% documented via echocardiogram on 08/11/2014,   ____________________________ CARDIO-PULMONARY TEST DATES EKG Date:  12/22/2014;  Holter/Event Monitor Date: 05/12/2008;  Echocardiography Date: 08/17/2014;  Chest Xray Date: 10/23/2013;  CT Scan Date:  04/30/2008   ____________________________ FAMILY HISTORY Brother -- Brother dead, Death of unknown cause Father -- Father dead, Death of unknown cause Mother -- Mother dead, Death of unknown cause Sister -- Sister alive and well ____________________________ SOCIAL HISTORY Alcohol Use:  does not use alcohol;  Smoking:  never smoked;  Diet:  regular diet;  Lifestyle:  married and 1 son;  Exercise:  some exercise;  Occupation:  repaired, Equities trader;  Residence:  lives with wife;   ____________________________ REVIEW OF SYSTEMS General:  longstanding obesity  Integumentary:rosacea, rash Ears, Nose, Throat, Mouth:  partial hearing loss right ear Respiratory: denies dyspnea, cough, wheezing or hemoptysis. Cardiovascular:  please review HPI  Genitourinary-Male: no dysuria, urgency, frequency, or nocturia  Musculoskeletal:  arthritis of the right knee, arthritis of the left shoulder Neurological:  dizziness  ____________________________ PHYSICAL EXAMINATION VITAL SIGNS  Blood Pressure:  124/70 Sitting, Left arm, large cuff  , 126/78 Standing, Left arm and large cuff   Pulse:  80/min. Weight:  362.00 lbs. Height:  72"BMI: 49  Constitutional:  pleasant white male in no acute distress, severely obese Skin:  warm and dry to touch, no apparent skin lesions, or masses noted. Head:  normocephalic, normal hair pattern, no masses or tenderness Neck:   supple, without massess. No JVD, thyromegaly or carotid bruits. Carotid upstroke  normal. Chest:  normal symmetry, clear to auscultation. Peripheral Pulses:  the femoral,dorsalis pedis, and posterior tibial pulses are full and equal bilaterally with no bruits auscultated. Extremities & Back:  no deformities, clubbing, cyanosis, erythema or edema observed. Normal muscle strength and tone. Neurological:  no gross motor or sensory deficits noted, affect appropriate, oriented x3. ____________________________ MOST RECENT LIPID PANEL 12/01/14  CHOL TOTL 181 mg/dl, LDL 104 NM, HDL 24 mg/dl and TRIGLYCER 263 mg/dl ____________________________ IMPRESSIONS/PLAN  1. Paroxysmal atrial fibrillation currently maintaining sinus rhythm 2. Hypertensive heart disease currently controlled 3. Severe morbid obesity 4. Long term use of anticoagulants without complications  Recommendations: EKG today shows normal sinus rhythm and there is no QT prolongation noted. Recommended continuing on Tikosyn and Xarelto. He mentions that he is going to be seeing a dietitian and we discussed that this would be a good idea and hopefully weight loss could help reduce recurrence of atrial fibrillation in the future. Followup in 6 months with EKG at that time ____________________________ TODAYS ORDERS  1. 12 Lead EKG: Today  2. Return Visit: 6 months  3. 12 Lead EKG: 6 months                       ____________________________ Cardiology Physician:  Kerry Hough MD [facility]

## 2014-12-22 NOTE — Progress Notes (Signed)
Thank you for looking after Stephen Francis. He is a nice gentleman.

## 2014-12-24 LAB — HM DIABETES EYE EXAM

## 2014-12-25 ENCOUNTER — Encounter: Payer: Medicare Other | Attending: Emergency Medicine | Admitting: *Deleted

## 2014-12-25 ENCOUNTER — Encounter: Payer: Self-pay | Admitting: *Deleted

## 2014-12-25 DIAGNOSIS — Z6841 Body Mass Index (BMI) 40.0 and over, adult: Secondary | ICD-10-CM | POA: Insufficient documentation

## 2014-12-25 DIAGNOSIS — E785 Hyperlipidemia, unspecified: Secondary | ICD-10-CM | POA: Diagnosis present

## 2014-12-25 DIAGNOSIS — R7303 Prediabetes: Secondary | ICD-10-CM

## 2014-12-25 NOTE — Patient Instructions (Addendum)
Consider trying [employer]- in soda aisle of grocery store Follow MyPlate recommendations when eating out: increase vegetables, try bakes or broiled or grilled meats, try whole grains instead of white Aim for 150 minutes physical activity each week.  Start low and go slow Use snack handout for snack ideas Try [employer] Good Morning Bacon (60% less fat)

## 2014-12-25 NOTE — Progress Notes (Signed)
  Medical Nutrition Therapy:  Appt start time: 0900 end time:  1000.  Assessment:  Primary concerns today: Stephen Francis is here for nutrition counseling pertaining to recenet diagnosis of prediabetes.  His A1c is 5.7%.  He would also like to lose weight.  He retired about 10-12 years ago and he's gained 80-90 pounds since.  He attributes this to poor eating habits and lack of physical activity.  His wife does the grocery shopping and the cooking.  Typically they eat out or get take out most meals. [place], [place], [employer], [place].  When at home he eats in front of the tv.  He is not a fast eater.    Preferred Learning Style:   No preference indicated   Learning Readiness:   Ready   MEDICATIONS: see list   DIETARY INTAKE:  Usual eating pattern includes 2-3 meals and 2 snacks per day.  Everyday foods include fast food.  Avoided foods include liver, lamb, certain kinds of chicken (likes chicken fingers, but not much else), broccoli, .    24-hr recall:  B ( AM): eggs, bacon; cereal (honey nut cheerios with raisin bran crunch.  Sometimes fruit added)  Snk ( AM): not usually  L ( PM): z[employer]'s, chick-fil-a, [employer], mcdonald's sometimes Snk ( PM): oreos, chips D ( PM): [place], [employer], [employer] Snk ( PM): cookies, chips Beverages: water, diet soda, coffee with sugar-free sweetener  Usual physical activity: yard work sometimes.  Has elliptical machine, but doesn't use it  Estimated energy needs for weight loss: 2400 calories 270 g carbohydrates 180 g protein 67 g fat  Progress Towards Goal(s):  In progress.   Nutritional Diagnosis:  [place]-2.1 Inpaired nutrition utilization As related to carbohydrates.  As evidenced by A1c 5.7%.    Intervention:  Nutrition counseling provided.  Discussed role of lifestyle modification in preventing diabetes, reducing lipids, and improved overall health.  Discussed MyPlate recommendations for meal planning: increasing non-starchy vegetables, whole  grain, choosing lean proteins, and reducing total carbohydrates, but not eliminating them.  Recommended whole grains.  Patient eats out a lot and doesn't know how to cook.  Suggested healthier options when eating out. Recommended increasing physical activity to 150 minutes/week minimum.  He likes to golf and play ping pong. Can also use his elliptical machine.    Teaching Method Utilized:  Visual Auditory   Handouts given during visit include:  My meal plan card  Low carb snacks  MyPlate for diabetes  Barriers to learning/adherence to lifestyle change: none  Demonstrated degree of understanding via:  Teach Back   Monitoring/Evaluation:  Dietary intake, exercise, labs, and body weight prn.

## 2015-03-02 ENCOUNTER — Encounter: Payer: Self-pay | Admitting: Emergency Medicine

## 2015-03-23 LAB — HM DIABETES EYE EXAM

## 2015-03-31 ENCOUNTER — Encounter: Payer: Self-pay | Admitting: Family Medicine

## 2015-04-06 ENCOUNTER — Ambulatory Visit: Payer: Medicare Other | Admitting: Emergency Medicine

## 2015-04-27 ENCOUNTER — Encounter: Payer: Self-pay | Admitting: Family Medicine

## 2015-04-27 ENCOUNTER — Ambulatory Visit (INDEPENDENT_AMBULATORY_CARE_PROVIDER_SITE_OTHER): Payer: Medicare Other | Admitting: Emergency Medicine

## 2015-04-27 ENCOUNTER — Encounter: Payer: Self-pay | Admitting: Emergency Medicine

## 2015-04-27 VITALS — BP 124/77 | HR 90 | Temp 98.0°F | Resp 16 | Ht 74.0 in | Wt 369.0 lb

## 2015-04-27 DIAGNOSIS — N390 Urinary tract infection, site not specified: Secondary | ICD-10-CM

## 2015-04-27 DIAGNOSIS — Z23 Encounter for immunization: Secondary | ICD-10-CM | POA: Diagnosis not present

## 2015-04-27 DIAGNOSIS — E785 Hyperlipidemia, unspecified: Secondary | ICD-10-CM

## 2015-04-27 DIAGNOSIS — R739 Hyperglycemia, unspecified: Secondary | ICD-10-CM | POA: Diagnosis not present

## 2015-04-27 LAB — LIPID PANEL
Cholesterol: 156 mg/dL (ref 125–200)
HDL: 26 mg/dL — ABNORMAL LOW (ref >=40)
LDL Cholesterol: 106 mg/dL (ref <130)
Total CHOL/HDL Ratio: 6 Ratio — ABNORMAL HIGH (ref <=5.0)
Triglycerides: 118 mg/dL (ref <150)
VLDL: 24 mg/dL (ref <30)

## 2015-04-27 LAB — POCT CBC
Granulocyte percent: 84 %G — AB (ref 37–80)
HEMATOCRIT: 44.1 % (ref 43.5–53.7)
Hemoglobin: 14.8 g/dL (ref 14.1–18.1)
Lymph, poc: 2.2 (ref 0.6–3.4)
MCH, POC: 29.3 pg (ref 27–31.2)
MCHC: 33.6 g/dL (ref 31.8–35.4)
MCV: 87.2 fL (ref 80–97)
MID (cbc): 0.1 (ref 0–0.9)
MPV: 6.3 fL (ref 0–99.8)
POC GRANULOCYTE: 12.3 — AB (ref 2–6.9)
POC LYMPH PERCENT: 15.1 %L (ref 10–50)
POC MID %: 0.9 %M (ref 0–12)
Platelet Count, POC: 279 10*3/uL (ref 142–424)
RBC: 5.06 M/uL (ref 4.69–6.13)
RDW, POC: 15.4 %
WBC: 14.6 10*3/uL — AB (ref 4.6–10.2)

## 2015-04-27 LAB — HEMOGLOBIN A1C: Hgb A1c MFr Bld: 5.9 % (ref 4.0–6.0)

## 2015-04-27 LAB — GLUCOSE, POCT (MANUAL RESULT ENTRY): POC GLUCOSE: 151 mg/dL — AB (ref 70–99)

## 2015-04-27 LAB — POCT GLYCOSYLATED HEMOGLOBIN (HGB A1C): Hemoglobin A1C: 5.9

## 2015-04-27 MED ORDER — CEFTRIAXONE SODIUM 1 G IJ SOLR
1.0000 g | Freq: Once | INTRAMUSCULAR | Status: AC
  Administered 2015-04-27: 1 g via INTRAMUSCULAR

## 2015-04-27 NOTE — Progress Notes (Addendum)
This chart was scribed for [name], MD by Moises Blood, Medical Scribe. This patient was seen in Room 22 and the patient's care was started 9:11 AM.  Chief Complaint:  Chief Complaint  Patient presents with  . Follow-up  . Diabetes    HPI: Stephen Francis is a 69 y.o. male who reports to [facility] today for follow up on his diabetes. He's been having a tiring few weeks.  Dental He had 2 root canals.   Dermatology He had a rash and went to dermatologist, given prednisone.   Orthopedics He pulled his leg and went to orthopedics and was also given prednisone.   Urology He went to urologist [facility]) and was given abx for urinary infection. He's going back next month to check his kidney. He's having some side effects of sweats with the abx, ciprofloxacin. He was measuring fever at home, but in the office, his temperature was nl.   Nutritionist He was recommended the usual things, cutting back for foods. He was suggested a soft drink, but patient described it as "stale soda without the syrup". He states that he usually denies soft drink intake. He usually drinks water and occasionally coffee.   Vision He sees [name] once a year, last seen in July 2016.   Immunizations He received flu shot today.   Personal He retired at 69 y.o.     Past Medical History  Diagnosis Date  . Atrial fibrillation ([place])   . Morbid obesity ([place])   . Hyperlipidemia   . Dysrhythmia     ATRIAL FIBRILATION   Past Surgical History  Procedure Laterality Date  . Tonsilectomy, adenoidectomy, bilateral myringotomy and tubes    . Tonsillectomy    . Cyst excision     Social History   Social History  . Marital Status: Married    Spouse Name: N/A  . Number of Children: N/A  . Years of Education: N/A   Social History Main Topics  . Smoking status: Never Smoker   . Smokeless tobacco: Former Systems developer    Types: Chew  . Alcohol Use: Yes     Comment: rarely  . Drug Use: No  . Sexual  Activity: Yes   Other Topics Concern  . None   Social History Narrative   Family History  Problem Relation Age of Onset  . Stroke Paternal Grandfather   . Asthma Mother   . Heart disease Father    Allergies  Allergen Reactions  . Niacin And Related Rash    Patient has flushing primarily.   Prior to Admission medications   Medication Sig Start Date End Date Taking? Authorizing Provider  Cholecalciferol (VITAMIN D-3) 1000 UNITS CAPS Take 2,000 Units by mouth daily.     Historical Provider, MD  diltiazem (CARDIZEM CD) 180 MG 24 hr capsule Take 1 capsule (180 mg total) by mouth daily. 09/10/14   Jacolyn Reedy, MD  dofetilide (TIKOSYN) 250 MCG capsule Take 1 capsule (250 mcg total) by mouth every 12 (twelve) hours. 09/10/14   Jacolyn Reedy, MD  fish oil-omega-3 fatty acids 1000 MG capsule Take 1 g by mouth 2 (two) times daily.     Historical Provider, MD  metoprolol (LOPRESSOR) 50 MG tablet Take 1 tablet (50 mg total) by mouth 2 (two) times daily. Needs office visit/CPE 09/22/12   Collene Leyden, PA-C  Multiple Vitamin (MULTIVITAMIN) tablet Take 1 tablet by mouth daily.    Historical Provider, MD  niacin 500 MG tablet  Take 500 mg by mouth 2 (two) times daily with a meal.    Historical Provider, MD  rivaroxaban (XARELTO) 20 MG TABS tablet Take 1 tablet (20 mg total) by mouth daily with supper. 10/24/13   Jacolyn Reedy, MD  sildenafil (VIAGRA) 100 MG tablet Take 0.5-1 tablets (50-100 mg total) by mouth daily as needed for erectile dysfunction. Patient not taking: Reported on 12/25/2014 04/03/14   Darlyne Russian, MD     ROS:  Constitutional: negative for chills, fever, night sweats, weight changes, or fatigue  HEENT: negative for vision changes, hearing loss, congestion, rhinorrhea, ST, epistaxis, or sinus pressure Cardiovascular: negative for chest pain or palpitations Respiratory: negative for hemoptysis, wheezing, shortness of breath, or cough Abdominal: negative for abdominal  pain, nausea, vomiting, diarrhea, or constipation Dermatological: negative for rash Neurologic: negative for headache, dizziness, or syncope All other systems reviewed and are otherwise negative with the exception to those above and in the HPI.  PHYSICAL EXAM: Filed Vitals:   04/27/15 0901  BP: 124/77  Pulse: 90  Temp: 98 F (36.7 C)  Resp: 16   Body mass index is 47.36 kg/(m^2).   General: Alert, no acute distress patient is very sweaty. He has a history of this. HEENT:  Normocephalic, atraumatic, oropharynx patent. Eye: [name] Cardiovascular:  Regular rate and rhythm, no rubs murmurs or gallops.  No Carotid bruits, radial pulse intact. No pedal edema.  Respiratory: Clear to auscultation bilaterally.  No wheezes, rales, or rhonchi.  No cyanosis, no use of accessory musculature Abdominal: No organomegaly, abdomen is soft and non-tender, positive bowel sounds. No masses. Musculoskeletal: Gait intact. No edema, tenderness Skin: No rashes. Neurologic: Facial musculature symmetric. Psychiatric: Patient acts appropriately throughout our interaction.  Lymphatic: No cervical or submandibular lymphadenopathy Genitourinary/Anorectal: No acute findings   LABS: Results for orders placed or performed in visit on 04/27/15  POCT glucose (manual entry)  Result Value Ref Range   POC Glucose 151 (A) 70 - 99 mg/dl  POCT glycosylated hemoglobin (Hb A1C)  Result Value Ref Range   Hemoglobin A1C 5.9   POCT CBC  Result Value Ref Range   WBC 14.6 (A) 4.6 - 10.2 K/uL   Lymph, poc 2.2 0.6 - 3.4   POC LYMPH PERCENT 15.1 10 - 50 %L   MID (cbc) 0.1 0 - 0.9   POC MID % 0.9 0 - 12 %M   POC Granulocyte 12.3 (A) 2 - 6.9   Granulocyte percent 84.0 (A) 37 - 80 %G   RBC 5.06 4.69 - 6.13 M/uL   Hemoglobin 14.8 14.1 - 18.1 g/dL   HCT, POC 44.1 43.5 - 53.7 %   MCV 87.2 80 - 97 fL   MCH, POC 29.3 27 - 31.2 pg   MCHC 33.6 31.8 - 35.4 g/dL   RDW, POC 15.4 %   Platelet Count, POC 279 142 - 424  K/uL   MPV 6.3 0 - 99.8 fL     EKG/XRAY:   Primary read interpreted by Dr. Everlene Farrier at [facility].   ASSESSMENT/PLAN: Patient currently under treatment for urinary tract infection with Cipro twice a day. His white count is 14 6 he is having some chills. Urine culture was done. Call placed to Dr. McDiarmid. He will be given a gram of Rocephin IM. We will follow up tomorrow on his status as far as the results of his urine culture.  By signing my name below, I, Moises Blood, attest that this documentation has been prepared under the  direction and in the presence of [name], MD. Electronically Signed: Moises Blood, [place]. 04/27/2015 , 9:11 AM .   Joh[place] sideeffects, risk and benefits, and alternatives of medications d/w patient. Patient is aware that all medications have potential sideeffects and we are unable to predict every sideeffect or drug-drug interaction that may occur.  [name] MD 04/27/2015 9:11 AM

## 2015-04-29 ENCOUNTER — Encounter: Payer: Self-pay | Admitting: Family Medicine

## 2015-05-16 ENCOUNTER — Other Ambulatory Visit: Payer: Self-pay | Admitting: Emergency Medicine

## 2015-05-18 NOTE — Telephone Encounter (Signed)
Patient was just see 11/17 no mention of Viagra can we refill?

## 2015-06-13 ENCOUNTER — Telehealth: Payer: Self-pay | Admitting: Family Medicine

## 2015-06-13 ENCOUNTER — Other Ambulatory Visit: Payer: Self-pay | Admitting: Emergency Medicine

## 2015-06-13 DIAGNOSIS — R059 Cough, unspecified: Secondary | ICD-10-CM

## 2015-06-13 DIAGNOSIS — R05 Cough: Secondary | ICD-10-CM

## 2015-06-13 MED ORDER — BENZONATATE 100 MG PO CAPS: 100.0000 mg | ORAL_CAPSULE | Freq: Three times a day (TID) | ORAL | Status: DC | PRN

## 2015-06-13 NOTE — Telephone Encounter (Signed)
Patient would like for you to send a RX to [facility] [address] he has a strong cough like hes had in the past like Bronchitis

## 2015-06-13 NOTE — Telephone Encounter (Signed)
I sent in [place]. If he needs the stronger cough med he will need to come in because those prescriptions have to be written

## 2015-06-14 NOTE — Telephone Encounter (Signed)
Pt advised.

## 2015-06-15 ENCOUNTER — Ambulatory Visit (INDEPENDENT_AMBULATORY_CARE_PROVIDER_SITE_OTHER): Payer: Medicare Other | Admitting: Family Medicine

## 2015-06-15 VITALS — BP 122/86 | HR 85 | Temp 98.6°F | Resp 16 | Ht 74.0 in | Wt 376.2 lb

## 2015-06-15 DIAGNOSIS — J209 Acute bronchitis, unspecified: Secondary | ICD-10-CM | POA: Diagnosis not present

## 2015-06-15 MED ORDER — ALBUTEROL SULFATE HFA 108 (90 BASE) MCG/ACT IN AERS: 2.0000 | INHALATION_SPRAY | RESPIRATORY_TRACT | Status: DC | PRN

## 2015-06-15 MED ORDER — HYDROCOD POLST-CPM POLST ER 10-8 MG/5ML PO SUER: 5.0000 mL | Freq: Two times a day (BID) | ORAL | Status: DC | PRN

## 2015-06-15 MED ORDER — LEVOFLOXACIN 500 MG PO TABS: 500.0000 mg | ORAL_TABLET | Freq: Every day | ORAL | Status: DC

## 2015-06-15 NOTE — Patient Instructions (Signed)

## 2015-06-15 NOTE — Progress Notes (Signed)
This chart was scribed for Stephen Haber MD, by Tamsen Roers, at [facility].  This patient was seen in room 8 and the patient's care was started at 9:34 AM.   Chief Complaint  Patient presents with   Cough    x 1 week   Wheezing    Patient ID: LOT WILES, male    DOB: 01-07-1946, 70 y.o.   MRN: [id number]  HPI  HPI Comments: Stephen Francis is a 71 y.o. male who presents to the [facility] complaining of a cough with wheezing onset 1 week ago and feels like he is "unable to get anything out".  He received cough tablets by Dr. Everlene Farrier two days ago but denies any significant relief.   He had slight sinus congestion this week and used saline spray which seemed to alleviate the congestion.  He feels occasional shortness of breath and sleeps in his recliner so he can be upright (which is the only position that allows him to sleep without snoring and with ease). Patient states that he makes "alot of noise" while he sleeps.  He currently denies a fever, sore throat or swelling in his extremities. Patient does not use an inhaler or a CPAP. He has had pneumonia in the past.  Patients grandson who is 61 was diagnosed with pneumonia. He is up to date with his pneumonia shots.    Patient Active Problem List   Diagnosis Date Noted   Diabetes mellitus type 2, noninsulin dependent ([place])    Long-term (current) use of anticoagulants    Impotence 04/08/2013   Morbid obesity ([place])    Hyperlipidemia    Rosacea 04/09/2012   Kidney stones 04/09/2012   Paroxysmal atrial fibrillation [facility])    Past Medical History  Diagnosis Date   Atrial fibrillation ([place])    Morbid obesity ([place])    Hyperlipidemia    Dysrhythmia     ATRIAL FIBRILATION   Past Surgical History  Procedure Laterality Date   Tonsilectomy, adenoidectomy, bilateral myringotomy and tubes     Tonsillectomy     Cyst excision     Allergies  Allergen Reactions   Niacin  And Related Rash    Patient has flushing primarily.   Prior to Admission medications   Medication Sig Start Date End Date Taking? Authorizing Provider  benzonatate (TESSALON) 100 MG capsule Take 1-2 capsules (100-200 mg total) by mouth 3 (three) times daily as needed for cough. 06/13/15  Yes Darlyne Russian, MD  Cholecalciferol (VITAMIN D-3) 1000 UNITS CAPS Take 2,000 Units by mouth daily.    Yes Historical Provider, MD  ciprofloxacin (CIPRO) 250 MG tablet Take 250 mg by mouth 2 (two) times daily.   Yes Historical Provider, MD  diltiazem (CARDIZEM CD) 180 MG 24 hr capsule Take 1 capsule (180 mg total) by mouth daily. 09/10/14  Yes Jacolyn Reedy, MD  dofetilide (TIKOSYN) 250 MCG capsule Take 1 capsule (250 mcg total) by mouth every 12 (twelve) hours. 09/10/14  Yes Jacolyn Reedy, MD  fish oil-omega-3 fatty acids 1000 MG capsule Take 1 g by mouth 2 (two) times daily.    Yes Historical Provider, MD  metoprolol (LOPRESSOR) 50 MG tablet Take 1 tablet (50 mg total) by mouth 2 (two) times daily. Needs office visit/CPE 09/22/12  Yes Collene Leyden, PA-C  Multiple Vitamin (MULTIVITAMIN) tablet Take 1 tablet by mouth daily.   Yes Historical Provider, MD  niacin 500 MG tablet Take 500 mg  by mouth 2 (two) times daily with a meal.   Yes Historical Provider, MD  rivaroxaban (XARELTO) 20 MG TABS tablet Take 1 tablet (20 mg total) by mouth daily with supper. 10/24/13  Yes Jacolyn Reedy, MD  VIAGRA 100 MG tablet TAKE ONE-HALF (1/2) TO ONE TABLET DAILY AS NEEDED FOR ERECTILE DYSFUNCTION 05/18/15  Yes Darlyne Russian, MD   Social History   Social History   Marital Status: Married    Spouse Name: N/A   Number of Children: N/A   Years of Education: N/A   Occupational History   Not on file.   Social History Main Topics   Smoking status: Never Smoker    Smokeless tobacco: Former Systems developer    Types: Chew   Alcohol Use: Yes     Comment: rarely   Drug Use: No   Sexual Activity: Yes   Other Topics  Concern   Not on file   Social History Narrative    Review of Systems  Constitutional: Negative for fever and chills.  Eyes: Negative for pain, redness and itching.  Respiratory: Positive for cough, shortness of breath and wheezing.   Cardiovascular: Negative for leg swelling.  Gastrointestinal: Negative for nausea and vomiting.  Musculoskeletal: Negative for neck pain and neck stiffness.  Neurological: Negative for syncope and speech difficulty.       Objective:   Physical Exam  Constitutional: He is oriented to person, place, and time. No distress.  HENT:  Head: Normocephalic and atraumatic.  Eyes: Pupils are equal, round, and reactive to light.  Neck: Normal range of motion.  Pulmonary/Chest: No respiratory distress.  expiratory wheezing rales in the right base as well.  no significant edema.   Neurological: He is alert and oriented to person, place, and time.  Psychiatric: He has a normal mood and affect. His behavior is normal.   Large tongue that partially obscures posterior pharynx    Assessment & Plan:   This chart was scribed in my presence and reviewed by me personally.    ICD-9-CM ICD-10-CM   1. Acute bronchitis, unspecified organism 466.0 J20.9 albuterol (PROVENTIL HFA;VENTOLIN HFA) 108 (90 Base) MCG/ACT inhaler     levofloxacin (LEVAQUIN) 500 MG tablet     chlorpheniramine-HYDROcodone (TUSSIONEX PENNKINETIC ER) 10-8 MG/5ML SUER     Signed, Stephen Haber, MD

## 2015-06-19 ENCOUNTER — Telehealth: Payer: Self-pay

## 2015-06-19 DIAGNOSIS — J209 Acute bronchitis, unspecified: Secondary | ICD-10-CM

## 2015-06-19 NOTE — Telephone Encounter (Signed)
Patient called in stating he needs his chlorpheniramine-HYDROcodone (TUSSIONEX PENNKINETIC ER) 10-8 MG/5ML SUER and his ciprofloxacin (CIPRO) 250 MG tablet refilled, he stated that he still has his cough and the benzonatate (TESSALON) 100 MG capsule he was given doesn't help with it at all. [facility] uses the [facility] [place] [place] - [address]  His call back number is [phone].

## 2015-06-20 ENCOUNTER — Other Ambulatory Visit: Payer: Self-pay | Admitting: Family Medicine

## 2015-06-20 MED ORDER — LEVOFLOXACIN 500 MG PO TABS: 500.0000 mg | ORAL_TABLET | Freq: Every day | ORAL | Status: DC

## 2015-06-20 MED ORDER — HYDROCOD POLST-CPM POLST ER 10-8 MG/5ML PO SUER: 5.0000 mL | Freq: Two times a day (BID) | ORAL | Status: DC | PRN

## 2015-06-22 NOTE — Telephone Encounter (Signed)
Notified pt Rx ready and Abx sent to pharm. Pt reported that he has gotten the Abx and will send wife for the tussionex. He reported that he is a little better than 7 days ago, but not significantly. Advised pt that is he is not a LOT better by the end of this round of Abx, or if worsens at any point, RTC. Pt agreed.

## 2015-10-26 ENCOUNTER — Ambulatory Visit: Payer: Medicare Other | Admitting: Emergency Medicine

## 2015-10-28 ENCOUNTER — Encounter: Payer: Self-pay | Admitting: Emergency Medicine

## 2015-10-28 ENCOUNTER — Ambulatory Visit (INDEPENDENT_AMBULATORY_CARE_PROVIDER_SITE_OTHER): Payer: Medicare Other | Admitting: Emergency Medicine

## 2015-10-28 VITALS — BP 138/80 | HR 84 | Temp 98.3°F | Resp 20 | Ht 73.5 in | Wt 382.0 lb

## 2015-10-28 DIAGNOSIS — Z1159 Encounter for screening for other viral diseases: Secondary | ICD-10-CM

## 2015-10-28 DIAGNOSIS — R739 Hyperglycemia, unspecified: Secondary | ICD-10-CM | POA: Diagnosis not present

## 2015-10-28 DIAGNOSIS — L608 Other nail disorders: Secondary | ICD-10-CM

## 2015-10-28 DIAGNOSIS — E785 Hyperlipidemia, unspecified: Secondary | ICD-10-CM

## 2015-10-28 DIAGNOSIS — L609 Nail disorder, unspecified: Secondary | ICD-10-CM

## 2015-10-28 DIAGNOSIS — E781 Pure hyperglyceridemia: Secondary | ICD-10-CM

## 2015-10-28 LAB — LIPID PANEL
CHOL/HDL RATIO: 7.6 ratio — AB (ref <=5.0)
Cholesterol: 205 mg/dL — ABNORMAL HIGH (ref 125–200)
HDL: 27 mg/dL — ABNORMAL LOW (ref >=40)
LDL CALC: 130 mg/dL — AB (ref <130)
Triglycerides: 238 mg/dL — ABNORMAL HIGH (ref <150)
VLDL: 48 mg/dL — ABNORMAL HIGH (ref <30)

## 2015-10-28 LAB — BASIC METABOLIC PANEL WITH GFR
BUN: 8 mg/dL (ref 7–25)
CALCIUM: 9.6 mg/dL (ref 8.6–10.3)
CO2: 25 mmol/L (ref 20–31)
CREATININE: 0.92 mg/dL (ref 0.70–1.25)
Chloride: 101 mmol/L (ref 98–110)
GFR, EST NON AFRICAN AMERICAN: 85 mL/min (ref >=60)
Glucose, Bld: 124 mg/dL — ABNORMAL HIGH (ref 65–99)
Potassium: 4.1 mmol/L (ref 3.5–5.3)
SODIUM: 140 mmol/L (ref 135–146)

## 2015-10-28 LAB — POCT GLYCOSYLATED HEMOGLOBIN (HGB A1C): Hemoglobin A1C: 6

## 2015-10-28 LAB — HEPATITIS C ANTIBODY: HCV Ab: NEGATIVE

## 2015-10-28 LAB — GLUCOSE, POCT (MANUAL RESULT ENTRY): POC GLUCOSE: 118 mg/dL — AB (ref 70–99)

## 2015-10-28 NOTE — Progress Notes (Signed)
Patient ID: Stephen Francis, male   DOB: July 25, 1945, 70 y.o.   MRN: [id number]    By signing my name below I, Tereasa Coop, attest that this documentation has been prepared under the direction and in the presence of [name], MD. Electonically Signed. Tereasa Coop, Scribe 10/28/2015 at 9:21 AM   Chief Complaint:  Chief Complaint  Patient presents with  . Follow-up  . Hyperglycemia  . Hyperlipidemia    HPI: Stephen Francis is a 70 y.o. male who reports to [facility] today for a follow up visit for his type 2 DM.   Pt also c/o hyperglycemia and hyperlipidemia.  Pt had bronchitis with persistent cough from January to March 2017. Cough has cleared up.  Pt has had multiple visits with a urologist in the past 6 months due to UTI. Pt's urinary symptoms have resolved completely.   Pt reports having an ingrown rt great toenail.  Pt has been taking his xarelto faithfully.  Pt has had the shingles vaccine and 2 pneumonia vaccines.   Pt has history of A-fib.  Past Medical History  Diagnosis Date  . Atrial fibrillation ([place])   . Morbid obesity ([place])   . Hyperlipidemia   . Dysrhythmia     ATRIAL FIBRILATION   Past Surgical History  Procedure Laterality Date  . Tonsilectomy, adenoidectomy, bilateral myringotomy and tubes    . Tonsillectomy    . Cyst excision     Social History   Social History  . Marital Status: Married    Spouse Name: N/A  . Number of Children: N/A  . Years of Education: N/A   Social History Main Topics  . Smoking status: Never Smoker   . Smokeless tobacco: Former Systems developer    Types: Chew  . Alcohol Use: Yes     Comment: rarely  . Drug Use: No  . Sexual Activity: Yes   Other Topics Concern  . None   Social History Narrative   Family History  Problem Relation Age of Onset  . Stroke Paternal Grandfather   . Asthma Mother   . Heart disease Father    Allergies  Allergen Reactions  . Niacin And Related Rash    Patient has flushing primarily.    Prior to Admission medications   Medication Sig Start Date End Date Taking? Authorizing Provider  Cholecalciferol (VITAMIN D-3) 1000 UNITS CAPS Take 2,000 Units by mouth daily.    Yes Historical Provider, MD  diltiazem (CARDIZEM CD) 180 MG 24 hr capsule Take 1 capsule (180 mg total) by mouth daily. 09/10/14  Yes Jacolyn Reedy, MD  dofetilide (TIKOSYN) 250 MCG capsule Take 1 capsule (250 mcg total) by mouth every 12 (twelve) hours. 09/10/14  Yes Jacolyn Reedy, MD  fish oil-omega-3 fatty acids 1000 MG capsule Take 1 g by mouth 2 (two) times daily.    Yes Historical Provider, MD  metoprolol (LOPRESSOR) 50 MG tablet Take 1 tablet (50 mg total) by mouth 2 (two) times daily. Needs office visit/CPE 09/22/12  Yes Collene Leyden, PA-C  Multiple Vitamin (MULTIVITAMIN) tablet Take 1 tablet by mouth daily.   Yes Historical Provider, MD  rivaroxaban (XARELTO) 20 MG TABS tablet Take 1 tablet (20 mg total) by mouth daily with supper. 10/24/13  Yes Jacolyn Reedy, MD  tamsulosin (FLOMAX) 0.4 MG CAPS capsule Take 0.4 mg by mouth.   Yes Historical Provider, MD  VIAGRA 100 MG tablet TAKE ONE-HALF (1/2) TO ONE TABLET DAILY AS NEEDED FOR ERECTILE DYSFUNCTION 05/18/15  Yes Darlyne Russian, MD  albuterol (PROVENTIL HFA;VENTOLIN HFA) 108 (90 Base) MCG/ACT inhaler Inhale 2 puffs into the lungs every 4 (four) hours as needed for wheezing or shortness of breath (cough, shortness of breath or wheezing.). Patient not taking: Reported on 10/28/2015 06/15/15   Robyn Haber, MD  niacin 500 MG tablet Take 500 mg by mouth 2 (two) times daily with a meal.    Historical Provider, MD     ROS: The patient denies fevers, chills, night sweats, unintentional weight loss, chest pain, palpitations, wheezing, dyspnea on exertion, nausea, vomiting, abdominal pain, dysuria, hematuria, melena, numbness, weakness, or tingling.   All other systems have been reviewed and were otherwise negative with the exception of those mentioned in the  HPI and as above.    PHYSICAL EXAM: Filed Vitals:   10/28/15 0806  BP: 138/80  Temp: 98.3 F (36.8 C)  Resp: 20   Body mass index is 49.71 kg/(m^2).   General: Alert, no acute distress HEENT:  Normocephalic, atraumatic, oropharynx patent. Pt has rhinophyma Eye: [facility] Cardiovascular:  Regular rate and rhythm with occasional skipped beats, no rubs murmurs or gallops.  No Carotid bruits, radial pulse intact. No pedal edema.  Respiratory: Clear to auscultation bilaterally.  No wheezes, rales, or rhonchi.  No cyanosis, no use of accessory musculature Abdominal: No organomegaly, abdomen is soft and non-tender, positive bowel sounds. Abd is obese. No masses. Musculoskeletal: Gait intact. No edema, tenderness. Pt has an ingrowing toenail on the rt great toe. Skin: No rashes. Neurologic: Facial musculature symmetric. Psychiatric: Patient acts appropriately throughout our interaction. Lymphatic: No cervical or submandibular lymphadenopathy  Repeat cardiac exam was regular rate and rhythm.  LABS: Results for orders placed or performed in visit on 10/28/15  POCT glucose (manual entry)  Result Value Ref Range   POC Glucose 118 (A) 70 - 99 mg/dl  POCT glycosylated hemoglobin (Hb A1C)  Result Value Ref Range   Hemoglobin A1C 6.0       EKG/XRAY:   Primary read interpreted by Dr. Everlene Farrier at [facility].   ASSESSMENT/PLAN: Patient doing well. He has a cardiologist he sees regular. He is under the care of the urologist. He sees his cardiologist regular. He is atrial fibrillation controlled rate but appeared in sinus today. He is on a blood thinner. Shots are up-to-date. He is going to follow-up [facility].I personally performed the services described in this documentation, which was scribed in my presence. The recorded information has been reviewed and is accurate.    Gross sideeffects, risk and benefits, and alternatives of medications d/w patient. Patient is aware that all  medications have potential sideeffects and we are unable to predict every sideeffect or drug-drug interaction that may occur.  [name] MD 10/28/2015 8:11 AM

## 2015-10-28 NOTE — Patient Instructions (Signed)
     IF you received an x-ray today, you will receive an invoice from [facility]. Please contact  Radiology at [phone] with questions or concerns regarding your invoice.   IF you received labwork today, you will receive an invoice from [facility]. Please contact [facility] at [phone] with questions or concerns regarding your invoice.   Our billing staff will not be able to assist you with questions regarding bills from these companies.  You will be contacted with the lab results as soon as they are available. The fastest way to get your results is to activate your My Chart account. Instructions are located on the last page of this paperwork. If you have not heard from us regarding the results in 2 weeks, please contact this office.      

## 2015-10-29 LAB — MICROALBUMIN, URINE: Microalb, Ur: 7.5 mg/dL

## 2015-11-22 ENCOUNTER — Ambulatory Visit (INDEPENDENT_AMBULATORY_CARE_PROVIDER_SITE_OTHER): Payer: Medicare Other | Admitting: Podiatry

## 2015-11-22 DIAGNOSIS — L600 Ingrowing nail: Secondary | ICD-10-CM | POA: Diagnosis not present

## 2015-11-22 DIAGNOSIS — B351 Tinea unguium: Secondary | ICD-10-CM

## 2015-11-22 NOTE — Progress Notes (Signed)
   Subjective:    Patient ID: Stephen Francis, male    DOB: 1945-09-18, 70 y.o.   MRN: [id number]  HPI    Review of Systems  All other systems reviewed and are negative.      Objective:   Physical Exam        Assessment & Plan:

## 2015-11-23 NOTE — Progress Notes (Signed)
Subjective:     Patient ID: Stephen Francis, male   DOB: 07/27/1945, 70 y.o.   MRN: [id number]  HPI patient presents stating I have a lot of pain in my big toenail right and it's damaged and it's been this way for a number of years and I can no longer cut it   Review of Systems  All other systems reviewed and are negative.      Objective:   Physical Exam  Constitutional: He is oriented to person, place, and time.  Cardiovascular: Intact distal pulses.   Musculoskeletal: Normal range of motion.  Neurological: He is oriented to person, place, and time.  Skin: Skin is warm.  Nursing note and vitals reviewed.  neurovascular status is found to be intact muscle strength is adequate range of motion within normal limits with patient found to have a damaged right hallux nail that's incurvated sore on the surfaces thick and impossible for the patient to cut. 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 I've recommended removal of the nail and a permanent status and I discussed the procedure with patient and reviewed the procedure and risk. At this time I went ahead infiltrated the right hallux 60 mg Xylocaine Marcaine mixture remove the hallux nail exposed matrix and applied phenol 5 applications 30 seconds followed by alcohol lavage and sterile dressing. Gave instructions on soaks and reappoint

## 2016-03-17 ENCOUNTER — Ambulatory Visit (INDEPENDENT_AMBULATORY_CARE_PROVIDER_SITE_OTHER): Payer: Medicare Other | Admitting: Physician Assistant

## 2016-03-17 DIAGNOSIS — Z23 Encounter for immunization: Secondary | ICD-10-CM

## 2017-04-24 ENCOUNTER — Ambulatory Visit (INDEPENDENT_AMBULATORY_CARE_PROVIDER_SITE_OTHER): Payer: Medicare Other | Admitting: Emergency Medicine

## 2017-04-24 DIAGNOSIS — Z23 Encounter for immunization: Secondary | ICD-10-CM

## 2017-09-01 ENCOUNTER — Other Ambulatory Visit: Payer: Self-pay

## 2017-09-01 ENCOUNTER — Encounter (HOSPITAL_COMMUNITY): Payer: Self-pay

## 2017-09-01 ENCOUNTER — Emergency Department (HOSPITAL_COMMUNITY)
Admission: EM | Admit: 2017-09-01 | Discharge: 2017-09-01 | Disposition: A | Discharge status: Home / Self Care | Payer: Medicare Other | Attending: Emergency Medicine | Admitting: Emergency Medicine

## 2017-09-01 DIAGNOSIS — Z79899 Other long term (current) drug therapy: Secondary | ICD-10-CM | POA: Insufficient documentation

## 2017-09-01 DIAGNOSIS — I4891 Unspecified atrial fibrillation: Secondary | ICD-10-CM | POA: Insufficient documentation

## 2017-09-01 DIAGNOSIS — N201 Calculus of ureter: Secondary | ICD-10-CM | POA: Insufficient documentation

## 2017-09-01 DIAGNOSIS — R1084 Generalized abdominal pain: Secondary | ICD-10-CM | POA: Diagnosis present

## 2017-09-01 DIAGNOSIS — N200 Calculus of kidney: Secondary | ICD-10-CM

## 2017-09-01 LAB — BASIC METABOLIC PANEL
ANION GAP: 11 (ref 5–15)
BUN: 14 mg/dL (ref 6–20)
CHLORIDE: 103 mmol/L (ref 101–111)
CO2: 24 mmol/L (ref 22–32)
CREATININE: 1.14 mg/dL (ref 0.61–1.24)
Calcium: 9.4 mg/dL (ref 8.9–10.3)
GFR calc non Af Amer: >60 mL/min (ref >60)
Glucose, Bld: 139 mg/dL — ABNORMAL HIGH (ref 65–99)
POTASSIUM: 3.7 mmol/L (ref 3.5–5.1)
SODIUM: 138 mmol/L (ref 135–145)

## 2017-09-01 LAB — CBC
HEMATOCRIT: 49.4 % (ref 39.0–52.0)
Hemoglobin: 15.7 g/dL (ref 13.0–17.0)
MCH: 29.5 pg (ref 26.0–34.0)
MCHC: 31.8 g/dL (ref 30.0–36.0)
MCV: 92.7 fL (ref 78.0–100.0)
PLATELETS: 246 10*3/uL (ref 150–400)
RBC: 5.33 MIL/uL (ref 4.22–5.81)
RDW: 14.3 % (ref 11.5–15.5)
WBC: 11.6 10*3/uL — AB (ref 4.0–10.5)

## 2017-09-01 LAB — URINALYSIS, ROUTINE W REFLEX MICROSCOPIC
BACTERIA UA: NONE SEEN
BILIRUBIN URINE: NEGATIVE
Glucose, UA: NEGATIVE mg/dL
Ketones, ur: NEGATIVE mg/dL
LEUKOCYTES UA: NEGATIVE
Nitrite: NEGATIVE
Protein, ur: 100 mg/dL — AB
SPECIFIC GRAVITY, URINE: 1.021 (ref 1.005–1.030)
SQUAMOUS EPITHELIAL / LPF: NONE SEEN
pH: 5 (ref 5.0–8.0)

## 2017-09-01 MED ORDER — OXYCODONE-ACETAMINOPHEN 5-325 MG PO TABS: 2.0000 | ORAL_TABLET | Freq: Four times a day (QID) | ORAL | 0 refills | Status: DC | PRN

## 2017-09-01 MED ORDER — ONDANSETRON HCL 4 MG/2ML IJ SOLN
4.0000 mg | Freq: Once | INTRAMUSCULAR | Status: AC
Start: 2017-09-01 — End: 2017-09-01
  Administered 2017-09-01: 4 mg via INTRAVENOUS
  Filled 2017-09-01: qty 2

## 2017-09-01 MED ORDER — MORPHINE SULFATE (PF) 4 MG/ML IV SOLN
4.0000 mg | Freq: Once | INTRAVENOUS | Status: AC
  Administered 2017-09-01: 4 mg via INTRAVENOUS
  Filled 2017-09-01: qty 1

## 2017-09-01 MED ORDER — ONDANSETRON 4 MG PO TBDP: 4.0000 mg | ORAL_TABLET | Freq: Four times a day (QID) | ORAL | 0 refills | Status: DC | PRN

## 2017-09-01 NOTE — ED Provider Notes (Signed)
TIME SEEN: 3:21 AM  CHIEF COMPLAINT: Left flank pain  HPI: Patient is a 72 year old male with history of atrial fibrillation, hyperlipidemia, obesity, kidney stones who presents to the emergency department with left flank pain.  Patient is followed by Dr. Vikki Ports at [facility] urology.  States he was seen a week ago for left flank pain and had a CT urogram which showed a 4 mm stone in the proximal ureter.  States that started on antibiotics for possible infection which he has completed.  States that he went and saw his urologist yesterday and he is scheduled to come back in a week for a procedure to remove the stone.  States he was told if his pain got worse to come to the ED.  He is taking 5 mg of hydrocodone 1 every 6 hours.  States this did not resolve his pain but did help improve it.  No fevers, vomiting, diarrhea, dysuria or gross hematuria.  ROS: See HPI Constitutional: no fever  Eyes: no drainage  ENT: no runny nose   Cardiovascular:  no chest pain  Resp: no SOB  GI: no vomiting GU: no dysuria Integumentary: no rash  Allergy: no hives  Musculoskeletal: no leg swelling  Neurological: no slurred speech ROS otherwise negative  PAST MEDICAL HISTORY/PAST SURGICAL HISTORY:  Past Medical History:  Diagnosis Date  . Atrial fibrillation ([place])   . Dysrhythmia    ATRIAL FIBRILATION  . Hyperlipidemia   . Morbid obesity ([place])     MEDICATIONS:  Prior to Admission medications   Medication Sig Start Date End Date Taking? Authorizing Provider  albuterol (PROVENTIL HFA;VENTOLIN HFA) 108 (90 Base) MCG/ACT inhaler Inhale 2 puffs into the lungs every 4 (four) hours as needed for wheezing or shortness of breath (cough, shortness of breath or wheezing.). Patient not taking: Reported on 10/28/2015 06/15/15   Robyn Haber, MD  Cholecalciferol (VITAMIN D-3) 1000 UNITS CAPS Take 2,000 Units by mouth daily.     [provider]  diltiazem (CARDIZEM CD) 180 MG 24 hr capsule Take 1 capsule  (180 mg total) by mouth daily. 09/10/14   Jacolyn Reedy, MD  dofetilide (TIKOSYN) 250 MCG capsule Take 1 capsule (250 mcg total) by mouth every 12 (twelve) hours. 09/10/14   Jacolyn Reedy, MD  fish oil-omega-3 fatty acids 1000 MG capsule Take 1 g by mouth 2 (two) times daily.     [provider]  metoprolol (LOPRESSOR) 50 MG tablet Take 1 tablet (50 mg total) by mouth 2 (two) times daily. Needs office visit/CPE 09/22/12   Collene Leyden, PA-C  Multiple Vitamin (MULTIVITAMIN) tablet Take 1 tablet by mouth daily.    [provider]  niacin 500 MG tablet Take 500 mg by mouth 2 (two) times daily with a meal.    [provider]  rivaroxaban (XARELTO) 20 MG TABS tablet Take 1 tablet (20 mg total) by mouth daily with supper. 10/24/13   Jacolyn Reedy, MD  tamsulosin (FLOMAX) 0.4 MG CAPS capsule Take 0.4 mg by mouth.    [provider]  VIAGRA 100 MG tablet TAKE ONE-HALF (1/2) TO ONE TABLET DAILY AS NEEDED FOR ERECTILE DYSFUNCTION 05/18/15   Darlyne Russian, MD    ALLERGIES:  Allergies  Allergen Reactions  . Niacin And Related Rash    Patient has flushing primarily.    SOCIAL HISTORY:  Social History   Tobacco Use  . Smoking status: Never Smoker  . Smokeless tobacco: Former Systems developer    Types: Loss adjuster, chartered  Substance Use Topics  . Alcohol use: Yes    Comment: rarely    FAMILY HISTORY: Family History  Problem Relation Age of Onset  . Stroke Paternal Grandfather   . Asthma Mother   . Heart disease Father     EXAM: BP (!) 196/101 (BP Location: Right Arm)   Pulse 91   Temp 98.9 F (37.2 C) (Oral)   Resp 16   SpO2 98%  CONSTITUTIONAL: Alert and oriented and responds appropriately to questions. Well-appearing; well-nourished; in no significant distress, obese HEAD: Normocephalic EYES: Conjunctivae clear, pupils appear equal, EOMI ENT: normal nose; moist mucous membranes NECK: Supple, no meningismus, no nuchal rigidity, no LAD  CARD: RRR; S1 and S2  appreciated; no murmurs, no clicks, no rubs, no gallops RESP: Normal chest excursion without splinting or tachypnea; breath sounds clear and equal bilaterally; no wheezes, no rhonchi, no rales, no hypoxia or respiratory distress, speaking full sentences ABD/GI: Normal bowel sounds; non-distended; soft, non-tender, no rebound, no guarding, no peritoneal signs, no hepatosplenomegaly BACK:  The back appears normal and is non-tender to palpation, there is no CVA tenderness EXT: Normal ROM in all joints; non-tender to palpation; no edema; normal capillary refill; no cyanosis, no calf tenderness or swelling    SKIN: Normal color for age and race; warm; no rash NEURO: Moves all extremities equally PSYCH: The patient's mood and manner are appropriate. Grooming and personal hygiene are appropriate.  MEDICAL DECISION MAKING: Patient here with pain from his left kidney stone.  I have confirmed with the radiologist that he had a CT urogram on March 26 which showed a 4 mm proximal ureteral stone at the level of the iliac crest with left hydronephrosis.  Labs here are unremarkable.  He does have blood in his urine but no sign of infection.  No fever.  Plan is to give IV medications for symptom relief.  ED PROGRESS: Pain completely resolved with 1 dose of morphine.  He states he is feeling much better and comfortable with plan for discharge home.  Will provide him with a prescription for Percocet to take instead of the hydrocodone and we have discussed that he can take 1-2 tablets every 6 hours for pain.  He has plans to follow-up with his urologist next week.  He has been urinating normally.  I do not feel he needs emergent urologic consultation at this time.  Patient and wife comfortable with this plan.  At this time, I do not feel there is any life-threatening condition present. I have reviewed and discussed all results (EKG, imaging, lab, urine as appropriate) and exam findings with patient/family. I have reviewed  nursing notes and appropriate previous records.  I feel the patient is safe to be discharged home without further emergent workup and can continue workup as an outpatient as needed. Discussed usual and customary return precautions. Patient/family verbalize understanding and are comfortable with this plan.  Outpatient follow-up has been provided if needed. All questions have been answered.      Ward, Delice Bison, DO 09/01/17 [phone]

## 2017-09-01 NOTE — ED Triage Notes (Signed)
Pt reports that he was diagnosed with a kidney stone in his L ureter today at his urologist. They told him to come in if his pain worsened. Hx of same. He denies hematuria today, but states that he did have some about 10 days ago for which he was treated with an antibiotic. A&Ox4. Ambulatory.

## 2017-09-01 NOTE — Discharge Instructions (Signed)
Please stop taking your hydrocodone.  You may take oxycodone 1-2 tablets every 6 hours as needed for pain.

## 2018-06-24 ENCOUNTER — Telehealth: Payer: Self-pay | Admitting: Cardiology

## 2018-06-24 NOTE — Telephone Encounter (Signed)
New Message   PT is in need of a medical exemption for [place] Duty   Please call

## 2018-06-24 NOTE — Telephone Encounter (Signed)
Called patient, LVM advising that he has not yet seen Dr.Hochrein and he would not be able to give medical information on a patient he has not seen. Advised patient to wait until appointment in February, if [place] Duty was before appointment date here he should contact his PCP office. Left call back number if questions.

## 2018-07-08 ENCOUNTER — Telehealth: Payer: Self-pay | Admitting: *Deleted

## 2018-07-08 NOTE — Telephone Encounter (Signed)
*  STAT* If patient is at the pharmacy, call can be transferred to refill team.   1. Which medications need to be refilled? (please list name of each medication and dose if known) Xarelto 20 mg qd   2. Which pharmacy/location (including street and city if local pharmacy) is medication to be sent to?[facility]   3. Do they need a 30 day or 90 day supply? [place]

## 2018-07-08 NOTE — Telephone Encounter (Signed)
Patient is seeing Dr Percival Spanish on 07-15-2018.  He has previously seen Dr Oran Rein.  Can you please refill.   Thank you, Elmyra Ricks

## 2018-07-09 ENCOUNTER — Telehealth: Payer: Self-pay | Admitting: Cardiology

## 2018-07-09 NOTE — Telephone Encounter (Signed)
New message   Pt c/o medication issue:  1. Name of Medication: rivaroxaban (XARELTO) 20 MG TABS tablet  2. How are you currently taking this medication (dosage and times per day)? 1 time daily  3. Are you having a reaction (difficulty breathing--STAT)? No   4. What is your medication issue? Per patient states that a new prescription needs to be written for this medication.

## 2018-07-09 NOTE — Telephone Encounter (Signed)
Spoke with patient and he will get new Rx for 90 day Monday at his visit, has enough to last until then. He uses mail order so prefers 90 day secondary to cost.

## 2018-07-09 NOTE — Telephone Encounter (Signed)
Call and spoke with Mr Volkert, pt want 90 tablet send into [facility], advised pt he has not been seen in a while and I send send in 30 days to his local pharmacy until he sees Dr Percival Spanish, pt stated that he have enough to last until he see Dr Percival Spanish on 02/17.

## 2018-07-10 ENCOUNTER — Ambulatory Visit: Payer: Medicare Other | Admitting: Cardiology

## 2018-07-13 NOTE — Progress Notes (Signed)
Cardiology Office Note   Date:  07/15/2018   ID:  Stephen Francis [birth year], MRN [id number]  PCP:  Stephen Kid, MD  Cardiologist:   No primary care provider on file.    Chief Complaint  Patient presents with  . PAF      History of Present Illness: Stephen Francis is a 73 y.o. male who was seen by Dr. Wynonia Francis  for follow atrial fib.  He was in the hospital in 2016 for Tikosyn for treatment of persistent atrial fib.  At that time he had what he describes as asymptomatic fib but apparently had rates in the 120s.  He was treated with anticoagulation and has been managed with the antiarrhythmic since then.  He says that since that time he is probably had paroxysms.  He has had an Alive Cor and will occasionally notice that his heart rate is irregular and it records as atrial fibrillation.  He said he has not had this in several months however.  It does not last.  He does not describe any presyncope or syncope.  He says it could last up to a couple of days but this is really uncommon.  He is not overly active.  He denies any chest pressure, neck or arm discomfort.  He said no new shortness of breath, PND or orthopnea.  He is relatively sedentary.  Past Medical History:  Diagnosis Date  . Atrial fibrillation ([place])   . Hyperlipidemia   . Morbid obesity ([place])     Past Surgical History:  Procedure Laterality Date  . CYST EXCISION    . TONSILECTOMY, ADENOIDECTOMY, BILATERAL MYRINGOTOMY AND TUBES       Current Outpatient Medications  Medication Sig Dispense Refill  . Cholecalciferol (VITAMIN D-3) 1000 UNITS CAPS Take 2,000 Units by mouth daily.     Marland Kitchen diltiazem (CARDIZEM CD) 180 MG 24 hr capsule Take 1 capsule (180 mg total) by mouth daily. 90 capsule 3  . dofetilide (TIKOSYN) 250 MCG capsule Take 1 capsule (250 mcg total) by mouth every 12 (twelve) hours. 28 capsule 12  . fish oil-omega-3 fatty acids 1000 MG capsule Take 1 g by mouth 2 (two) times daily.     . metoprolol  tartrate (LOPRESSOR) 50 MG tablet Take 75 mg by mouth 2 (two) times daily.    . Multiple Vitamin (MULTIVITAMIN) tablet Take 1 tablet by mouth daily.    . rivaroxaban (XARELTO) 20 MG TABS tablet Take 1 tablet (20 mg total) by mouth daily with supper. 90 tablet 3  . tamsulosin (FLOMAX) 0.4 MG CAPS capsule Take 0.4 mg by mouth.     No current facility-administered medications for this visit.     Allergies:   Niacin and related    ROS:  Please see the history of present illness.   Otherwise, review of systems are positive for none.   All other systems are reviewed and negative.    PHYSICAL EXAM: VS:  BP 132/80   Pulse 77   Ht 6\' 1"  (1.854 m)   Wt (!) 375 lb 12.8 oz (170.5 kg)   BMI 49.58 kg/m  , BMI Body mass index is 49.58 kg/m. GENERAL:  Well appearing HEENT:  Pupils equal round and reactive, fundi not visualized, oral mucosa unremarkable NECK:  No jugular venous distention, waveform within normal limits, carotid upstroke brisk and symmetric, no bruits, no thyromegaly LYMPHATICS:  No cervical, inguinal adenopathy LUNGS:  Clear to auscultation bilaterally BACK:  No CVA tenderness CHEST:  Unremarkable HEART:  PMI not displaced or sustained,S1 and S2 within normal limits, no S3, no S4, no clicks, no rubs, no murmurs ABD:  Flat, positive bowel sounds normal in frequency in pitch, no bruits, no rebound, no guarding, no midline pulsatile mass, no hepatomegaly, no splenomegaly, morbidly obese EXT:  2 plus pulses throughout, no edema, no cyanosis no clubbing SKIN:  No rashes no nodules NEURO:  Cranial nerves II through XII grossly intact, motor grossly intact throughout PSYCH:  Cognitively intact, oriented to person place and time    EKG:  EKG is ordered today. The ekg ordered today demonstrates sinus rhythm, rate 76, nonspecific lateral T wave inversions, no acute ST-T wave changes.   Recent Labs: 09/01/2017: BUN 14; Creatinine, Ser 1.14; Hemoglobin 15.7; Platelets 246; Potassium 3.7;  Sodium 138    Lipid Panel    Component Value Date/Time   CHOL 205 (H) 10/28/2015 0844   TRIG 238 (H) 10/28/2015 0844   HDL 27 (L) 10/28/2015 0844   CHOLHDL 7.6 (H) 10/28/2015 0844   VLDL 48 (H) 10/28/2015 0844   LDLCALC 130 (H) 10/28/2015 0844      Wt Readings from Last 3 Encounters:  07/15/18 (!) 375 lb 12.8 oz (170.5 kg)  10/28/15 (!) 382 lb (173.3 kg)  06/15/15 (!) 376 lb 3.2 oz (170.6 kg)      Other studies Reviewed: Additional studies/ records that were reviewed today include: Labs. Review of the above records demonstrates:  Please see elsewhere in the note.     ASSESSMENT AND PLAN:  ATRIAL FIB:   Stephen Francis has a CHA2DS2 - VASc score of 3.  I think the Stephen Francis is working.  He tolerates anticoagulation.  I will be checking routine blood work like a magnesium and also CBC.  No change in therapy however.  MORBID OBESITY: We had a long discussion about this and I gave him specific instructions for diet and exercise.  DM: Hemoglobin A1c was 6.2.  He will continue the meds as listed.  HYPERTENSIVE HEART DISEASE: His blood pressures well controlled.  I did review the 2016 echo and he had some moderate LVH but normal LV function.  At this point I will continue to manage this with blood pressure control.   Current medicines are reviewed at length with the patient today.  The patient does not have concerns regarding medicines.  The following changes have been made:  no change  Labs/ tests ordered today include:   Orders Placed This Encounter  Procedures  . Basic Metabolic Panel (BMET)  . Magnesium  . CBC  . EKG 12-Lead     Disposition:   FU with me in six months.     Signed, Stephen Breeding, MD  07/15/2018 11:16 AM    [facility]

## 2018-07-15 ENCOUNTER — Encounter (INDEPENDENT_AMBULATORY_CARE_PROVIDER_SITE_OTHER): Payer: Self-pay

## 2018-07-15 ENCOUNTER — Ambulatory Visit (INDEPENDENT_AMBULATORY_CARE_PROVIDER_SITE_OTHER): Payer: Medicare Other | Admitting: Cardiology

## 2018-07-15 ENCOUNTER — Encounter: Payer: Self-pay | Admitting: Cardiology

## 2018-07-15 VITALS — BP 132/80 | HR 77 | Ht 73.0 in | Wt 375.8 lb

## 2018-07-15 DIAGNOSIS — Z79899 Other long term (current) drug therapy: Secondary | ICD-10-CM

## 2018-07-15 DIAGNOSIS — I119 Hypertensive heart disease without heart failure: Secondary | ICD-10-CM

## 2018-07-15 DIAGNOSIS — I480 Paroxysmal atrial fibrillation: Secondary | ICD-10-CM | POA: Diagnosis not present

## 2018-07-15 MED ORDER — RIVAROXABAN 20 MG PO TABS: 20.0000 mg | ORAL_TABLET | Freq: Every day | ORAL | 3 refills | Status: DC

## 2018-07-15 NOTE — Patient Instructions (Signed)
Medication Instructions:  Continue current medications  If you need a refill on your cardiac medications before your next appointment, please call your pharmacy.  Labwork: CBC, BMP and Magnesium today  HERE IN OUR OFFICE AT [facility]  You will NOT need to fast   Take the provided lab slips with you to the lab for your blood draw.   When you have your labs (blood work) drawn today and your tests are completely normal, you will receive your results only by MyChart Message (if you have MyChart) -OR-  A paper copy in the mail.  If you have any lab test that is abnormal or we need to change your treatment, we will call you to review these results.  Testing/Procedures: None Ordered  Follow-Up: You will need a follow up appointment in 6 months.  Please call our office 2 months in advance to schedule this appointment.  You may see Dr Percival Spanish or one of the following Advanced Practice Providers on your designated Care Team:   Rosaria Ferries, PA-C . Jory Sims, DNP, ANP  At [facility], you and your health needs are our priority.  As part of our continuing mission to provide you with exceptional heart care, we have created designated Provider Care Teams.  These Care Teams include your primary Cardiologist (physician) and Advanced Practice Providers (APPs -  Physician Assistants and Nurse Practitioners) who all work together to provide you with the care you need, when you need it.   Thank you for choosing [facility] at [facility]!!

## 2018-07-16 ENCOUNTER — Encounter: Payer: Self-pay | Admitting: *Deleted

## 2018-07-16 LAB — CBC
HEMATOCRIT: 48.8 % (ref 37.5–51.0)
HEMOGLOBIN: 16.1 g/dL (ref 13.0–17.7)
MCH: 29.3 pg (ref 26.6–33.0)
MCHC: 33 g/dL (ref 31.5–35.7)
MCV: 89 fL (ref 79–97)
Platelets: 265 10*3/uL (ref 150–450)
RBC: 5.5 x10E6/uL (ref 4.14–5.80)
RDW: 12.8 % (ref 11.6–15.4)
WBC: 8.4 10*3/uL (ref 3.4–10.8)

## 2018-07-16 LAB — BASIC METABOLIC PANEL
BUN / CREAT RATIO: 10 (ref 10–24)
BUN: 9 mg/dL (ref 8–27)
CALCIUM: 9.7 mg/dL (ref 8.6–10.2)
CO2: 26 mmol/L (ref 20–29)
CREATININE: 0.92 mg/dL (ref 0.76–1.27)
Chloride: 101 mmol/L (ref 96–106)
GFR, EST AFRICAN AMERICAN: 96 mL/min/{1.73_m2} (ref >59)
GFR, EST NON AFRICAN AMERICAN: 83 mL/min/{1.73_m2} (ref >59)
Glucose: 120 mg/dL — ABNORMAL HIGH (ref 65–99)
Potassium: 4.5 mmol/L (ref 3.5–5.2)
Sodium: 142 mmol/L (ref 134–144)

## 2018-07-16 LAB — MAGNESIUM: Magnesium: 2.1 mg/dL (ref 1.6–2.3)

## 2018-11-13 ENCOUNTER — Telehealth: Payer: Self-pay | Admitting: *Deleted

## 2018-11-13 NOTE — Telephone Encounter (Signed)
A message was left, re: follow up visit. 

## 2018-11-14 ENCOUNTER — Other Ambulatory Visit: Payer: Self-pay

## 2018-11-14 MED ORDER — DOFETILIDE 250 MCG PO CAPS: 250.0000 ug | ORAL_CAPSULE | Freq: Two times a day (BID) | ORAL | 10 refills | Status: DC

## 2018-11-15 ENCOUNTER — Telehealth: Payer: Self-pay | Admitting: *Deleted

## 2018-11-15 NOTE — Telephone Encounter (Signed)
Patient left a msg on the refill vm wanting to know why his rx for tikosyn was only authorized for a quantity of 28. He needs for this to be changed to a ninety day supply and [facility].

## 2018-11-18 MED ORDER — METOPROLOL TARTRATE 50 MG PO TABS: 75.0000 mg | ORAL_TABLET | Freq: Two times a day (BID) | ORAL | 3 refills | Status: DC

## 2018-11-18 MED ORDER — DILTIAZEM HCL ER COATED BEADS 180 MG PO CP24: 180.0000 mg | ORAL_CAPSULE | Freq: Every day | ORAL | 3 refills | Status: DC

## 2018-11-18 MED ORDER — ATORVASTATIN CALCIUM 20 MG PO TABS: 20.0000 mg | ORAL_TABLET | ORAL | 3 refills | Status: DC

## 2018-11-18 MED ORDER — DOFETILIDE 250 MCG PO CAPS: 250.0000 ug | ORAL_CAPSULE | Freq: Two times a day (BID) | ORAL | 3 refills | Status: DC

## 2018-11-18 MED ORDER — RIVAROXABAN 20 MG PO TABS: 20.0000 mg | ORAL_TABLET | Freq: Every day | ORAL | 3 refills | Status: DC

## 2018-11-27 ENCOUNTER — Telehealth: Payer: Self-pay | Admitting: Cardiology

## 2018-11-27 NOTE — Telephone Encounter (Signed)
OK 

## 2018-11-27 NOTE — Telephone Encounter (Signed)
Received a call from [facility] NP with [facility] calling to inform Dr.Hochrein patient had a increase in micro albumin in urine.He wanted to increase patient's Losartan to 100 mg.He wanted to make sure Dr.Hochrein was ok with the increase and ask him if any other medication needed to be decreased.Message sent to [place] for advice.

## 2018-11-28 NOTE — Telephone Encounter (Signed)
Spoke to [place] NP at [place] advised ok to increase Losartan to 100 mg daily.

## 2018-11-28 NOTE — Addendum Note (Signed)
Addended by: Kathyrn Lass on: 11/28/2018 08:34 AM   Modules accepted: Orders

## 2018-12-18 ENCOUNTER — Telehealth: Payer: Self-pay | Admitting: Cardiology

## 2018-12-18 NOTE — Telephone Encounter (Signed)
° °   [facility] Pre-operative Risk Assessment    Request for surgical clearance:  1. What type of surgery is being performed? Prostate biopsy  2. When is this surgery scheduled? 01/02/19  3. What type of clearance is required (medical clearance vs. Pharmacy clearance to hold med vs. Both)? Medication clearance  4. Are there any medications that need to be held prior to surgery and how long? XARELTO    5 days prior   5. Practice name and name of physician performing surgery? [facility] / Dr. Gloriann Loan   6. What is your office phone number [phone]   7.   What is your office fax number [phone]  8.   Anesthesia type (None, local, MAC, general) ? Local    Stephen Francis 12/18/2018, 3:36 PM  _________________________________________________________________   (provider comments below)

## 2018-12-19 NOTE — Telephone Encounter (Signed)
   Primary Cardiologist: Minus Breeding, MD  Chart reviewed as part of pre-operative protocol coverage. Patient was contacted 12/19/2018 in reference to pre-operative risk assessment for pending surgery as outlined below.  FIDENCIO DUDDY was last seen on 07/15/2018 by Dr. Percival Spanish.  YAO HYPPOLITE was doing well from a cardiac perspective. His atrial fibrillation was in control and he was without chest pain, SOB, or palpitations.  He diagnosis of Afib on Xarelto for anticoagulation.    Procedure: Prostate biopsy Date of procedure: 01/02/2019  CHADS2-VASc score of  3 (HTN, AGE, DM2)  CrCl 115 ml/min  Per office protocol and pharmacy recommendations, the patient can hold Xarelto for 2-3 days prior to procedure.    Therefore, based on ACC/AHA guidelines, the patient would be at acceptable risk for the planned procedure without further cardiovascular testing.   I will route this recommendation to the requesting party via Epic fax function and remove from pre-op pool.  Please call with questions.  Kathyrn Drown, NP 12/19/2018, 8:41 AM

## 2018-12-19 NOTE — Telephone Encounter (Signed)
Patient with diagnosis of Afib on Xarelto for anticoagulation.    Procedure: Prostate biopsy Date of procedure: 01/02/2019  CHADS2-VASc score of  3 (HTN, AGE, DM2)  CrCl 115 ml/min  Per office protocol, patient can hold Xarelto for 2-3 days prior to procedure.

## 2019-02-16 NOTE — Progress Notes (Signed)
Cardiology Office Note   Date:  02/17/2019   ID:  Stephen, Francis 08-Jun-1945, MRN [id number]  PCP:  Cari Caraway, MD  Cardiologist:   Minus Breeding, MD    Chief Complaint  Patient presents with  . Atrial Fibrillation      History of Present Illness: Stephen Francis is a 73 y.o. male who was seen by Dr. Wynonia Lawman  for follow atrial fib.  He was in the hospital in 2016 for Tikosyn for treatment of persistent atrial fib.  At that time he had what he describes as asymptomatic fib but apparently had rates in the 120s.  He was treated with anticoagulation and has been managed with the antiarrhythmic since then.  He says that since that time he is probably had paroxysms.  He has had an Alive Cor and will occasionally notice that his heart rate is irregular and it records as atrial fibrillation.    Since I last saw him he has done relatively well. The patient denies any new symptoms such as chest discomfort, neck or arm discomfort. There has been no new shortness of breath, PND or orthopnea. There have been no reported palpitations, presyncope or syncope.  He feels the paroxysms infrequently.  However, the rate seems to be well-controlled and they are short-lived.  He denies presyncope or syncope.  He said no chest pressure, neck or arm discomfort.  He is not doing too much activity because he does not like wearing the mask so he does not go out.  He does his lawn work and has to walk up an incline from his out building.  He has no overt symptoms with this.    Past Medical History:  Diagnosis Date  . Atrial fibrillation ([place])   . Hyperlipidemia   . Morbid obesity ([place])     Past Surgical History:  Procedure Laterality Date  . CYST EXCISION    . TONSILECTOMY, ADENOIDECTOMY, BILATERAL MYRINGOTOMY AND TUBES       Current Outpatient Medications  Medication Sig Dispense Refill  . atorvastatin (LIPITOR) 20 MG tablet Take 1 tablet (20 mg total) by mouth every other day. 45  tablet 3  . Cholecalciferol (VITAMIN D-3) 1000 UNITS CAPS Take 2,000 Units by mouth daily.     Marland Kitchen diltiazem (CARDIZEM CD) 180 MG 24 hr capsule Take 1 capsule (180 mg total) by mouth daily. 90 capsule 3  . dofetilide (TIKOSYN) 250 MCG capsule Take 1 capsule (250 mcg total) by mouth every 12 (twelve) hours. 180 capsule 3  . fish oil-omega-3 fatty acids 1000 MG capsule Take 1 g by mouth 2 (two) times daily.     Marland Kitchen losartan (COZAAR) 100 MG tablet Take 1 tablet (100 mg total) by mouth daily. 90 tablet 3  . metoprolol tartrate (LOPRESSOR) 50 MG tablet Take 1.5 tablets (75 mg total) by mouth 2 (two) times daily. 270 tablet 3  . Multiple Vitamin (MULTIVITAMIN) tablet Take 1 tablet by mouth daily.    . rivaroxaban (XARELTO) 20 MG TABS tablet Take 1 tablet (20 mg total) by mouth daily with supper. 90 tablet 3  . tamsulosin (FLOMAX) 0.4 MG CAPS capsule Take 0.4 mg by mouth.     No current facility-administered medications for this visit.     Allergies:   Niacin and related    ROS:  Please see the history of present illness.   Otherwise, review of systems are positive for none.   All other systems are reviewed and negative.  PHYSICAL EXAM: VS:  BP (!) 157/90   Pulse 68   Temp (!) 97.2 F (36.2 C)   Ht 6\' 2"  (1.88 m)   Wt (!) 375 lb 6.4 oz (170.3 kg)   SpO2 96%   BMI 48.20 kg/m  , BMI Body mass index is 48.2 kg/m. GENERAL:  Well appearing NECK:  No jugular venous distention, waveform within normal limits, carotid upstroke brisk and symmetric, no bruits, no thyromegaly LUNGS:  Clear to auscultation bilaterally CHEST:  Unremarkable HEART:  PMI not displaced or sustained,S1 and S2 within normal limits, no S3, no S4, no clicks, no rubs, no murmurs ABD:  Flat, positive bowel sounds normal in frequency in pitch, no bruits, no rebound, no guarding, no midline pulsatile mass, no hepatomegaly, no splenomegaly EXT:  2 plus pulses throughout, no edema, no cyanosis no clubbing   EKG:  EKG is  ordered  today. The ekg ordered today demonstrates sinus rhythm, rate 66, nonspecific lateral T wave inversions, no acute ST-T wave changes.  Of note the QTc calculated to be 456.   Recent Labs: 07/15/2018: BUN 9; Creatinine, Ser 0.92; Hemoglobin 16.1; Magnesium 2.1; Platelets 265; Potassium 4.5; Sodium 142    Lipid Panel    Component Value Date/Time   CHOL 205 (H) 10/28/2015 0844   TRIG 238 (H) 10/28/2015 0844   HDL 27 (L) 10/28/2015 0844   CHOLHDL 7.6 (H) 10/28/2015 0844   VLDL 48 (H) 10/28/2015 0844   LDLCALC 130 (H) 10/28/2015 0844      Wt Readings from Last 3 Encounters:  02/17/19 (!) 375 lb 6.4 oz (170.3 kg)  07/15/18 (!) 375 lb 12.8 oz (170.5 kg)  10/28/15 (!) 382 lb (173.3 kg)      Other studies Reviewed: Additional studies/ records that were reviewed today include: Labs. Review of the above records demonstrates:  Please see elsewhere in the note.     ASSESSMENT AND PLAN:  ATRIAL FIB:   Stephen Francis has a CHA2DS2 - VASc score of 3.  He is on the right dose of medications.  He has some short paroxysms but much better on the Tikosyn and so he will continue with meds as listed.  MORBID OBESITY:     We have discussed this at length.  He understands the need to lose weight with diet and exercise.   DM: Hemoglobin A1c was 6.3.  No change in therapy.   HYPERTENSIVE HEART DISEASE: His blood pressures is elevated today but at home it is in the 120s ti 130s over 70s.  He will keep a BP diary.  No change in therapy.     Current medicines are reviewed at length with the patient today.  The patient does not have concerns regarding medicines.  The following changes have been made:  None  Labs/ tests ordered today include: None  Orders Placed This Encounter  Procedures  . EKG 12-Lead     Disposition:   FU with me in 12 months.     Signed, Minus Breeding, MD  02/17/2019 10:24 AM    [facility]

## 2019-02-17 ENCOUNTER — Encounter: Payer: Self-pay | Admitting: Cardiology

## 2019-02-17 ENCOUNTER — Other Ambulatory Visit: Payer: Self-pay

## 2019-02-17 ENCOUNTER — Ambulatory Visit (INDEPENDENT_AMBULATORY_CARE_PROVIDER_SITE_OTHER): Payer: Medicare Other | Admitting: Cardiology

## 2019-02-17 VITALS — BP 157/90 | HR 68 | Temp 97.2°F | Ht 74.0 in | Wt 375.4 lb

## 2019-02-17 DIAGNOSIS — I119 Hypertensive heart disease without heart failure: Secondary | ICD-10-CM | POA: Diagnosis not present

## 2019-02-17 DIAGNOSIS — I480 Paroxysmal atrial fibrillation: Secondary | ICD-10-CM

## 2019-02-17 NOTE — Patient Instructions (Signed)

## 2019-02-19 ENCOUNTER — Other Ambulatory Visit: Payer: Self-pay

## 2019-08-20 ENCOUNTER — Other Ambulatory Visit: Payer: Self-pay | Admitting: Cardiology

## 2019-11-02 ENCOUNTER — Other Ambulatory Visit: Payer: Self-pay | Admitting: Cardiology

## 2019-11-03 NOTE — Telephone Encounter (Signed)
Rx request sent to pharmacy.  

## 2019-11-25 ENCOUNTER — Other Ambulatory Visit: Payer: Self-pay | Admitting: Cardiology

## 2019-11-25 NOTE — Telephone Encounter (Signed)
74yo Male 170kg  Last OV 02/17/19 Scr = 1.02 on 6/24 eagle per [facility]

## 2019-12-10 ENCOUNTER — Other Ambulatory Visit: Payer: Self-pay | Admitting: Urology

## 2019-12-10 DIAGNOSIS — C61 Malignant neoplasm of prostate: Secondary | ICD-10-CM

## 2020-01-03 ENCOUNTER — Other Ambulatory Visit: Payer: Self-pay | Admitting: Cardiology

## 2020-01-10 ENCOUNTER — Other Ambulatory Visit: Payer: Self-pay

## 2020-01-10 ENCOUNTER — Ambulatory Visit
Admission: RE | Admit: 2020-01-10 | Discharge: 2020-01-10 | Disposition: A | Discharge status: Home / Self Care | Payer: Medicare Other | Source: Ambulatory Visit | Attending: Urology | Admitting: Urology

## 2020-01-10 DIAGNOSIS — C61 Malignant neoplasm of prostate: Secondary | ICD-10-CM

## 2020-02-16 ENCOUNTER — Other Ambulatory Visit: Payer: Self-pay | Admitting: Cardiology

## 2020-02-22 DIAGNOSIS — Z7189 Other specified counseling: Secondary | ICD-10-CM | POA: Insufficient documentation

## 2020-02-22 NOTE — Progress Notes (Signed)
Cardiology Office Note   Date:  02/23/2020   ID:  Stephen, Francis 07/21/1945, MRN [id number]  PCP:  Cari Caraway, MD  Cardiologist:   Minus Breeding, MD    Chief Complaint  Patient presents with  . Atrial Fibrillation      History of Present Illness: Stephen Francis is a 74 y.o. male who was seen by Dr. Wynonia Lawman  for follow atrial fib.  He was in the hospital in 2016 for Tikosyn for treatment of persistent atrial fib.  At that time he had what he describes as asymptomatic fib but apparently had rates in the 120s.  He was treated with anticoagulation and has been managed with the antiarrhythmic since then.  He says that since that time he is probably had paroxysms.  He has had an Alive Cor and will occasionally notice that his heart rate is irregular and it records as atrial fibrillation.    Since I last saw him he has done well.  He said some paroxysms and thinks he was in A. fib when he saw his primary provider most recently.  However, they are not particularly symptomatic or long lived. The patient denies any new symptoms such as chest discomfort, neck or arm discomfort. There has been no new shortness of breath, PND or orthopnea. There have been no reported palpitations, presyncope or syncope.   Past Medical History:  Diagnosis Date  . Atrial fibrillation ([place])   . Hyperlipidemia   . Morbid obesity ([place])     Past Surgical History:  Procedure Laterality Date  . CYST EXCISION    . TONSILECTOMY, ADENOIDECTOMY, BILATERAL MYRINGOTOMY AND TUBES       Current Outpatient Medications  Medication Sig Dispense Refill  . atorvastatin (LIPITOR) 20 MG tablet TAKE 1 TABLET EVERY OTHER  DAY 45 tablet 0  . Cholecalciferol (VITAMIN D-3) 1000 UNITS CAPS Take 2,000 Units by mouth daily.     Marland Kitchen diltiazem (CARDIZEM CD) 180 MG 24 hr capsule TAKE 1 CAPSULE DAILY 90 capsule 1  . dofetilide (TIKOSYN) 250 MCG capsule TAKE ONE CAPSULE BY MOUTH EVERY TWELVE HOURS 180 capsule 1  . fish  oil-omega-3 fatty acids 1000 MG capsule Take 1 g by mouth 2 (two) times daily.     Marland Kitchen losartan (COZAAR) 100 MG tablet Take 1 tablet (100 mg total) by mouth daily. 90 tablet 3  . metoprolol tartrate (LOPRESSOR) 50 MG tablet TAKE ONE AND ONE-HALF      TABLETS TWICE A DAY 270 tablet 3  . Multiple Vitamin (MULTIVITAMIN) tablet Take 1 tablet by mouth daily.    . tamsulosin (FLOMAX) 0.4 MG CAPS capsule Take 0.4 mg by mouth.    Alveda Reasons 20 MG TABS tablet TAKE 1 TABLET DAILY WITH   SUPPER 90 tablet 1   No current facility-administered medications for this visit.    Allergies:   Niacin and related    ROS:  Please see the history of present illness.   Otherwise, review of systems are positive for none.   All other systems are reviewed and negative.    PHYSICAL EXAM: VS:  BP (!) 142/80   Pulse 75   Ht 6\' 2"  (1.88 m)   Wt (!) 370 lb (167.8 kg)   SpO2 93%   BMI 47.51 kg/m  , BMI Body mass index is 47.51 kg/m. GENERAL:  Well appearing NECK:  No jugular venous distention, waveform within normal limits, carotid upstroke brisk and symmetric, no bruits, no thyromegaly LUNGS:  Clear to auscultation bilaterally CHEST:  Unremarkable HEART:  PMI not displaced or sustained,S1 and S2 within normal limits, no S3, no S4, no clicks, no rubs, no murmurs ABD:  Flat, positive bowel sounds normal in frequency in pitch, no bruits, no rebound, no guarding, no midline pulsatile mass, no hepatomegaly, no splenomegaly EXT:  2 plus pulses throughout, no edema, no cyanosis no clubbing   EKG:  EKG is  ordered today. The ekg ordered today demonstrates sinus rhythm, rate 75, nonspecific lateral T wave inversions, no acute ST-T wave changes.  Of note the QTc calculated to be 437   Recent Labs: No results found for requested labs within last 8760 hours.    Lipid Panel    Component Value Date/Time   CHOL 205 (H) 10/28/2015 0844   TRIG 238 (H) 10/28/2015 0844   HDL 27 (L) 10/28/2015 0844   CHOLHDL 7.6 (H)  10/28/2015 0844   VLDL 48 (H) 10/28/2015 0844   LDLCALC 130 (H) 10/28/2015 0844      Wt Readings from Last 3 Encounters:  02/23/20 (!) 370 lb (167.8 kg)  02/17/19 (!) 375 lb 6.4 oz (170.3 kg)  07/15/18 (!) 375 lb 12.8 oz (170.5 kg)      Other studies Reviewed: Additional studies/ records that were reviewed today include: Labs. Review of the above records demonstrates:  Please see elsewhere in the note.     ASSESSMENT AND PLAN:  ATRIAL FIB:   Mr. Stephen Francis has a CHA2DS2 - VASc score of 3.   He has brief paroxysms but otherwise does very well.  He tolerates his anticoagulation.  I am going to get labs from his primary provider to make sure he had normal CBC.  Otherwise no change in therapy.   MORBID OBESITY:   We have discussed this.    DM: Hemoglobin A1c was 6.6.  No change in therapy   HYPERTENSIVE HEART DISEASE:    His blood pressure by his report is usually well controlled.  No change in therapy.  COVID EDCUATION: He has been vaccinated and we discussed the booster shot today.   Current medicines are reviewed at length with the patient today.  The patient does not have concerns regarding medicines.  The following changes have been made:  None  Labs/ tests ordered today include: None  Orders Placed This Encounter  Procedures  . EKG 12-Lead     Disposition:   FU with me in 12 months.     Signed, Minus Breeding, MD  02/23/2020 12:54 PM    [facility]

## 2020-02-23 ENCOUNTER — Ambulatory Visit (INDEPENDENT_AMBULATORY_CARE_PROVIDER_SITE_OTHER): Payer: Medicare Other | Admitting: Cardiology

## 2020-02-23 ENCOUNTER — Encounter: Payer: Self-pay | Admitting: Cardiology

## 2020-02-23 ENCOUNTER — Other Ambulatory Visit: Payer: Self-pay

## 2020-02-23 VITALS — BP 142/80 | HR 75 | Ht 74.0 in | Wt 370.0 lb

## 2020-02-23 DIAGNOSIS — I119 Hypertensive heart disease without heart failure: Secondary | ICD-10-CM

## 2020-02-23 DIAGNOSIS — E118 Type 2 diabetes mellitus with unspecified complications: Secondary | ICD-10-CM

## 2020-02-23 DIAGNOSIS — Z7189 Other specified counseling: Secondary | ICD-10-CM

## 2020-02-23 DIAGNOSIS — I480 Paroxysmal atrial fibrillation: Secondary | ICD-10-CM | POA: Diagnosis not present

## 2020-02-23 NOTE — Patient Instructions (Signed)
Medication Instructions:  No changes *If you need a refill on your cardiac medications before your next appointment, please call your pharmacy*   Lab Work: No Labs If you have labs (blood work) drawn today and your tests are completely normal, you will receive your results only by: Marland Kitchen MyChart Message (if you have MyChart) OR . A paper copy in the mail If you have any lab test that is abnormal or we need to change your treatment, we will call you to review the results.   Testing/Procedures: No Testing    Follow-Up: At [facility], you and your health needs are our priority.  As part of our continuing mission to provide you with exceptional heart care, we have created designated Provider Care Teams.  These Care Teams include your primary Cardiologist (physician) and Advanced Practice Providers (APPs -  Physician Assistants and Nurse Practitioners) who all work together to provide you with the care you need, when you need it.   Your next appointment:   1 year(s)  The format for your next appointment:   In Person  Provider:   Minus Breeding, MD

## 2020-03-05 ENCOUNTER — Telehealth: Payer: Self-pay | Admitting: Cardiology

## 2020-03-05 NOTE — Telephone Encounter (Signed)
OK to refill if that is the question.

## 2020-03-05 NOTE — Telephone Encounter (Signed)
Reached out to [place], PA department and she will work on PA for patient Spoke with patient and he has enough to last

## 2020-03-05 NOTE — Telephone Encounter (Signed)
*  STAT* If patient is at the pharmacy, call can be transferred to refill team.   1. Which medications need to be refilled? (please list name of each medication and dose if known) dofetilide (TIKOSYN) 250 MCG capsule   2. Which pharmacy/location (including street and city if local pharmacy) is medication to be sent to? [facility] / pharmacy called and stated that this med is going to need a PA done on it.   Fax number [phone] 3. Do they need a 30 day or 90 day supply? [place]

## 2020-03-06 ENCOUNTER — Other Ambulatory Visit: Payer: Self-pay | Admitting: Cardiology

## 2020-03-08 NOTE — Telephone Encounter (Signed)
**Note De-Identified Dereck Agerton Obfuscation** Following message received from covermymeds:  Eda Keys Key: [id number] - PA Case ID: [id number] Outcome Denied today  Request Reference Number: [id number]. DOFETILIDE CAP 250MCG is denied for not meeting the prior authorization requirement(s). Details of this decision are in the notice attached below or have been faxed to you. Appeals are not supported through [place]. Please refer to the fax case notice for appeals information and instructions.  DrugDofetilide 250MCG capsules  [employer] Electronic Prior Authorization Form [phone] NCPDP)  Will forward to our [place] office as the denial letter will be faxed there explaining why the pt did not meet the requirements.

## 2020-03-08 NOTE — Telephone Encounter (Signed)
**Note De-Identified Stephen Francis Obfuscation** I started a Dofetilide PA through covermymeds: Key: [id number]

## 2020-03-11 ENCOUNTER — Telehealth: Payer: Self-pay | Admitting: Cardiology

## 2020-03-11 NOTE — Telephone Encounter (Signed)
New Message:     Need a Prior Authorization for pt's Dofetilide.

## 2020-03-11 NOTE — Telephone Encounter (Signed)
I spoke with [facility] and let them know prior authorization has been started and it appears it has been denied but we are waiting for the explanation in the denial letter.

## 2020-03-12 NOTE — Telephone Encounter (Signed)
**Note De-Identified Stephen Francis Obfuscation** See phone note from 10/8.

## 2020-03-12 NOTE — Telephone Encounter (Signed)
**Note De-Identified Majed Pellegrin Obfuscation** I called [employer] to get an explanation for this Dofetilide PA denial as I never received the denial letter.  Per Dallie Dad. at [facility] the pts ins plan is Part B and only covers inpatient medications and not outpatient medications at all so Dofetilide is excluded from coverage.  I will forward this phone note to Dr Percival Spanish and our [place] triage nurses for advisement to the pt as there is a pt asst program for Tikosyn but will take time for a approval/denial.

## 2020-03-13 NOTE — Telephone Encounter (Signed)
Butch Penny, Is there anything you guys can do to help [place] get this man his Tikosyn?  Thanks.  Dr. Percival Spanish

## 2020-03-15 NOTE — Telephone Encounter (Addendum)
**Note De-Identified Toris Laverdiere Obfuscation** I started another Dofetilide PA through covermymeds: Key: [id number]  We receieved a denial on the 1st PA I did based on the information on the only Ins card we have on file for him which covers Part B which is for Inpatient meds only.  I called [facility] and was advised by Rose that the insurance card we have is not the card the pt should be using for his Part D medications at his pharmacy. She advised that I contact the pt to get the needed information.  I called the pt and he provided the following information.: [facility] ID: [id number]                                 BIN: [id number]                                 PCN: [id number]                                 GRP: [id number] This is a commercial plan and not Medicare or Medicaid.  I have asked him to present this card to the front office staff at our [place] office at his next f/u and he states that he will. He is requesting that I call him back with the determination on this Dofetilide PA.

## 2020-03-15 NOTE — Telephone Encounter (Signed)
**Note De-Identified Stephen Francis Obfuscation** I will contact the pt and advise of GoodRX.

## 2020-03-15 NOTE — Telephone Encounter (Signed)
Thank you all.  We just need to make sure that he does not run out of anything.  Let me know if I need to do anything.  [employer]

## 2020-03-15 NOTE — Telephone Encounter (Signed)
**Note De-Identified Stephen Francis Obfuscation** I started another Dofetilide PA through covermymeds: Key: [id number]  We receieved a denial on the 1st PA I did based on the information on the only Ins card we have on file for him which covers Part B which is for Inpatient meds only.  I called [facility] and was advised by Rose that the insurance card we have is not the card the pt should be using for his Part D medications at his pharmacy. She advised that I contact the pt to get the needed information.  I called the pt and he provided the following information.: [facility] ID: [id number]                                 BIN: [id number]                                 PCN: [id number]                                 GRP: [id number] This is a commercial plan and not Medicare or Medicaid.  I have asked him to present this card to the front office staff at our [place] office at his next f/u and he states that he will. He is requesting that I call him back with the determination on this Dofetilide PA.

## 2020-03-15 NOTE — Telephone Encounter (Signed)
Dofetilide is on goodrx - he should be able to get a 30 day supply at [facility] for around $30. Would encourage pt to sign up for part D since it's enrollment time.

## 2020-03-16 ENCOUNTER — Other Ambulatory Visit: Payer: Self-pay

## 2020-03-16 MED ORDER — DOFETILIDE 250 MCG PO CAPS: ORAL_CAPSULE | ORAL | 1 refills | Status: DC

## 2020-03-16 NOTE — Telephone Encounter (Signed)
**Note De-Identified Brooklinn Longbottom Obfuscation** Following message received from covermymeds: [place]: [id number] - PA Case ID: [id number] Outcome  Approved on October 18  Additional information will be provided in the approval communication.   Drug Dofetilide 250MCG capsules  Form [facility] Electronic PA Form [phone] NCPDP)  Since this pt sees Dr Percival Spanish in our [place] office the approval letter from [place] with details of this approval will be fax to that office.  I have notified the pt and his pharmacy of this approval.

## 2020-03-16 NOTE — Telephone Encounter (Signed)
**Note De-Identified Hali Balgobin Obfuscation** Following message received from covermymeds: [place]: [id number] - PA Case ID: [id number] Outcome  Approved on October 18  Additional information will be provided in the approval communication.   Drug Dofetilide 250MCG capsules  Form [facility] Electronic PA Form [phone] NCPDP)  Since this pt sees Dr Percival Spanish in our [place] office the approval letter from [place] with details of this approval will be fax to that office.  I have notified the pt and his pharmacy of this approval.

## 2020-03-19 ENCOUNTER — Other Ambulatory Visit: Payer: Self-pay

## 2020-03-24 MED ORDER — DOFETILIDE 250 MCG PO CAPS: 250.0000 ug | ORAL_CAPSULE | Freq: Two times a day (BID) | ORAL | 3 refills | Status: DC

## 2020-03-24 NOTE — Telephone Encounter (Signed)
Pharmacy called in and stated they are still not able to process this refill.  They stated they are not able to "run" the the RX number.  They are stating they needs a all together new script sent over for the Dofetilide   [phone]-Pharmacy

## 2020-03-24 NOTE — Addendum Note (Signed)
Addended by: Stephani Police on: 03/24/2020 09:55 AM   Modules accepted: Orders

## 2020-03-24 NOTE — Telephone Encounter (Addendum)
New RX needed to be sent to [facility] per their request.. for the pts Dofetilide.   New Rx sent but had to D/C the previous order prior to sending in the new RX.

## 2020-05-16 ENCOUNTER — Other Ambulatory Visit: Payer: Self-pay | Admitting: Cardiology

## 2020-08-02 ENCOUNTER — Other Ambulatory Visit: Payer: Self-pay | Admitting: Cardiology

## 2020-08-11 ENCOUNTER — Other Ambulatory Visit: Payer: Self-pay | Admitting: Cardiology

## 2020-08-14 ENCOUNTER — Other Ambulatory Visit: Payer: Self-pay | Admitting: Cardiology

## 2020-10-22 ENCOUNTER — Telehealth: Payer: Self-pay

## 2020-10-22 NOTE — Telephone Encounter (Signed)
   Patient Name: Stephen Francis  DOB: 08-12-1945  MRN: [id number]   Primary Cardiologist: Minus Breeding, MD  Chart reviewed as part of pre-operative protocol coverage.   75 year old male with history with history of PAF on Xarelto, DM, HTN, and morbid obesity who is planning to undergo a prostate biopsy (date TBD) with Urology having requested his Xarelto be held for 3 days prior. Per office protocol, I will route to our pharmacy team for recommendation on anticoagulation.    Christell Faith, PA-C 10/22/2020, 3:56 PM

## 2020-10-22 NOTE — Telephone Encounter (Signed)
Patient with diagnosis of afib on Xarelto for anticoagulation.    Procedure: Prostate u/s biposy Date of procedure: TBD  CHA2DS2-VASc Score = 3  This indicates a 3.2% annual risk of stroke. The patient's score is based upon: CHF History: No HTN History: Yes Diabetes History: Yes Stroke History: No Vascular Disease History: No Age Score: 1 Gender Score: 0     CrCl 105 ml/min Platelet count 302  Per office protocol, patient can hold Xarelto for 3 days prior to procedure.

## 2020-10-22 NOTE — Telephone Encounter (Signed)
   [facility] Pre-operative Risk Assessment    Patient Name: Stephen Francis  DOB: [birth year]  MRN: [id number]  Request for surgical clearance:  1. What type of surgery is being performed? PROSTATE U/S BX   2. When is this surgery scheduled? TBD   3. What type of clearance is required (medical clearance vs. Pharmacy clearance to hold med vs. Both)? BOTH  4. Are there any medications that need to be held prior to surgery and how long? XARELTO 3 DAYS PRIOR   5. Practice name and name of physician performing surgery? [facility] DR EUGENE BELL   6. What is the office phone number? [phone]   7.   What is the office fax number? [phone]  8.   Anesthesia type (None, local, MAC, general) ? NOT LISTED

## 2020-10-26 NOTE — Telephone Encounter (Signed)
   Name: ZANNIE RUNKLE  DOB: February 03, 1946  MRN: [id number]   Primary Cardiologist: Minus Breeding, MD  Chart reviewed as part of pre-operative protocol coverage. Patient was contacted 10/26/2020 in reference to pre-operative risk assessment for pending surgery as outlined below.  DONNELL WION was last seen on 02/23/20 by Dr. Percival Spanish.  Since that day, EMMONS TOTH has done fine from a cardiac standpoint. He is able to complete 4 METs without anginal complaints.  Therefore, based on ACC/AHA guidelines, the patient would be at acceptable risk for the planned procedure without further cardiovascular testing.   The patient was advised that if he develops new symptoms prior to surgery to contact our office to arrange for a follow-up visit, and he verbalized understanding.  Per pharmacy recommendations, patient can hold xarelto 3 days prior to his upcoming prostate biopsy with plans to restart when cleared to do so by his urologist.   I will route this recommendation to the requesting party via [place] fax function and remove from pre-op pool. Please call with questions.  Abigail Butts, PA-C 10/26/2020, 10:54 AM

## 2020-11-14 ENCOUNTER — Other Ambulatory Visit: Payer: Self-pay | Admitting: Cardiology

## 2020-11-15 NOTE — Telephone Encounter (Signed)
Prescription refill request for Xarelto received.  Indication:atrial fib Last office visit:9/21 Weight:167.8 kg Age:75 PZP:SUGAY LABS CrCl:NEEDS LABS

## 2020-11-23 ENCOUNTER — Other Ambulatory Visit: Payer: Self-pay

## 2020-11-23 ENCOUNTER — Telehealth: Payer: Self-pay | Admitting: Cardiology

## 2020-11-23 DIAGNOSIS — I480 Paroxysmal atrial fibrillation: Secondary | ICD-10-CM

## 2020-11-23 MED ORDER — RIVAROXABAN 20 MG PO TABS: ORAL_TABLET | ORAL | 0 refills | Status: DC

## 2020-11-23 NOTE — Telephone Encounter (Signed)
79m, 167.8kg, scr(OVERDUE), was instructed by raquel pharmd to send one 90 day supply w/ instructions to get labs

## 2020-11-23 NOTE — Telephone Encounter (Signed)
*  STAT* If patient is at the pharmacy, call can be transferred to refill team.   1. Which medications need to be refilled? (please list name of each medication and dose if known)   rivaroxaban (XARELTO) 20 MG TABS tablet    2. Which pharmacy/location (including street and city if local pharmacy) is medication to be sent to? [facility] [place] [place] [facility]  3. Do they need a 30 day or 90 day supply? 90 day supply   PT states that he was only given 30 tablets

## 2020-11-23 NOTE — Telephone Encounter (Signed)
REFILL SENT FOR 90 DAYS and explained to pt via mychart msg

## 2020-12-14 ENCOUNTER — Other Ambulatory Visit: Payer: Self-pay | Admitting: Cardiology

## 2020-12-14 MED ORDER — RIVAROXABAN 20 MG PO TABS: ORAL_TABLET | ORAL | 0 refills | Status: DC

## 2020-12-14 NOTE — Telephone Encounter (Signed)
25m, 167.8kg, Creatinine, Serum 1.020 mg/ 11/20/2019(OUTDATED), ccr 153.1, lovw/HOCHREIN 02/23/20

## 2020-12-22 ENCOUNTER — Other Ambulatory Visit (HOSPITAL_COMMUNITY): Payer: Self-pay | Admitting: Urology

## 2020-12-22 ENCOUNTER — Other Ambulatory Visit: Payer: Self-pay | Admitting: Urology

## 2020-12-22 DIAGNOSIS — C61 Malignant neoplasm of prostate: Secondary | ICD-10-CM

## 2020-12-31 ENCOUNTER — Encounter (HOSPITAL_COMMUNITY)
Admission: RE | Admit: 2020-12-31 | Discharge: 2020-12-31 | Disposition: A | Discharge status: Home / Self Care | Payer: Medicare Other | Source: Ambulatory Visit | Attending: Urology | Admitting: Urology

## 2020-12-31 ENCOUNTER — Ambulatory Visit (HOSPITAL_COMMUNITY)
Admission: RE | Admit: 2020-12-31 | Discharge: 2020-12-31 | Disposition: A | Discharge status: Home / Self Care | Payer: Medicare Other | Source: Ambulatory Visit | Attending: Urology | Admitting: Urology

## 2020-12-31 ENCOUNTER — Other Ambulatory Visit: Payer: Self-pay

## 2020-12-31 DIAGNOSIS — C61 Malignant neoplasm of prostate: Secondary | ICD-10-CM | POA: Insufficient documentation

## 2020-12-31 MED ORDER — TECHNETIUM TC 99M MEDRONATE IV KIT
22.0000 | PACK | Freq: Once | INTRAVENOUS | Status: AC
  Administered 2020-12-31: 22 via INTRAVENOUS

## 2021-01-03 ENCOUNTER — Other Ambulatory Visit: Payer: Self-pay | Admitting: Cardiology

## 2021-01-11 ENCOUNTER — Other Ambulatory Visit: Payer: Self-pay

## 2021-01-11 ENCOUNTER — Encounter: Payer: Self-pay | Admitting: Radiation Oncology

## 2021-01-11 ENCOUNTER — Ambulatory Visit: Payer: Medicare Other

## 2021-01-11 ENCOUNTER — Ambulatory Visit: Payer: Medicare Other | Admitting: Radiation Oncology

## 2021-01-11 ENCOUNTER — Ambulatory Visit
Admission: RE | Admit: 2021-01-11 | Discharge: 2021-01-11 | Disposition: A | Discharge status: Home / Self Care | Payer: Medicare Other | Source: Ambulatory Visit | Attending: Radiation Oncology | Admitting: Radiation Oncology

## 2021-01-11 DIAGNOSIS — I4891 Unspecified atrial fibrillation: Secondary | ICD-10-CM | POA: Diagnosis not present

## 2021-01-11 DIAGNOSIS — E785 Hyperlipidemia, unspecified: Secondary | ICD-10-CM | POA: Diagnosis not present

## 2021-01-11 DIAGNOSIS — Z7901 Long term (current) use of anticoagulants: Secondary | ICD-10-CM | POA: Diagnosis not present

## 2021-01-11 DIAGNOSIS — Z79899 Other long term (current) drug therapy: Secondary | ICD-10-CM | POA: Diagnosis not present

## 2021-01-11 DIAGNOSIS — C61 Malignant neoplasm of prostate: Secondary | ICD-10-CM | POA: Insufficient documentation

## 2021-01-11 DIAGNOSIS — E669 Obesity, unspecified: Secondary | ICD-10-CM | POA: Insufficient documentation

## 2021-01-11 HISTORY — DX: Malignant neoplasm of prostate: C61

## 2021-01-11 NOTE — Progress Notes (Signed)
Radiation Oncology         [phone] ________________________________  Initial Outpatient Consultation - Conducted via MyChart due to current COVID-19 concerns for limiting patient exposure  Name: Stephen Francis MRN: [id number]  Date: 01/11/2021  DOB: 11-29-1945  CC:Cari Caraway, MD  Lucas Mallow, MD   REFERRING PHYSICIAN: Lucas Mallow, MD  DIAGNOSIS: 75 y.o. gentleman with Stage T1c adenocarcinoma of the prostate with Gleason score of 4+4, and PSA of 13.1.    ICD-10-CM   1. Malignant neoplasm of prostate ([place])  C61       HISTORY OF PRESENT ILLNESS: Stephen Francis is a 75 y.o. male with a diagnosis of prostate cancer. He was initially diagnosed with low-volume Gleason 3+3 prostate cancer in 12/2018 by Dr. Gloriann Loan. PSA at that time was 9.11. They opted to proceed with active surveillance. Due to body habitus, he was unable to undergo surveillance MRI.  His PSA increased to 13.1 in 09/2020. The patient proceeded to transrectal ultrasound with 12 biopsies of the prostate on 12/03/20.  The prostate volume measured 105.29 cc.  Out of 12 core biopsies, 2 were positive.  The maximum Gleason score was 4+4, and this was seen in left apex. Additionally, Gleason 4+3 was seen in left apex lateral.  He underwent staging CT A/P and bone scan on 12/31/20. Both were negative for metastatic disease.  The patient reviewed the biopsy results with his urologist and he has kindly been referred today for discussion of potential radiation treatment options.   PREVIOUS RADIATION THERAPY: No  PAST MEDICAL HISTORY:  Past Medical History:  Diagnosis Date   Atrial fibrillation ([place])    Hyperlipidemia    Morbid obesity ([place])    Prostate cancer ([place])       PAST SURGICAL HISTORY: Past Surgical History:  Procedure Laterality Date   CYST EXCISION     prostates biopsy     TONSILECTOMY, ADENOIDECTOMY, BILATERAL MYRINGOTOMY AND TUBES      FAMILY HISTORY:  Family History  Problem Relation  Age of Onset   Stroke Paternal Grandfather    Asthma Mother    Heart disease Father     SOCIAL HISTORY:  Social History   Socioeconomic History   Marital status: Married    Spouse name: Not on file   Number of children: Not on file   Years of education: Not on file   Highest education level: Not on file  Occupational History   Not on file  Tobacco Use   Smoking status: Never   Smokeless tobacco: Former    Types: Chew  Substance and Sexual Activity   Alcohol use: Yes    Comment: rarely   Drug use: No   Sexual activity: Yes  Other Topics Concern   Not on file  Social History Narrative   Not on file   Social Determinants of Health   Financial Resource Strain: Not on file  Food Insecurity: Not on file  Transportation Needs: Not on file  Physical Activity: Not on file  Stress: Not on file  Social Connections: Not on file  Intimate Partner Violence: Not on file    ALLERGIES: Niacin and related  MEDICATIONS:  Current Outpatient Medications  Medication Sig Dispense Refill   atorvastatin (LIPITOR) 20 MG tablet TAKE 1 TABLET EVERY OTHER  DAY 45 tablet 1   Cholecalciferol (VITAMIN D-3) 1000 UNITS CAPS Take 2,000 Units by mouth daily.      diltiazem (CARDIZEM CD) 180 MG 24 hr capsule  TAKE 1 CAPSULE DAILY 90 capsule 1   dofetilide (TIKOSYN) 250 MCG capsule TAKE 1 CAPSULE BY MOUTH EVERY 12 HOURS 180 capsule 3   fish oil-omega-3 fatty acids 1000 MG capsule Take 1 g by mouth 2 (two) times daily.      losartan (COZAAR) 100 MG tablet Take 1 tablet (100 mg total) by mouth daily. 90 tablet 3   metoprolol tartrate (LOPRESSOR) 50 MG tablet TAKE ONE AND ONE-HALF      TABLETS TWICE A DAY 270 tablet 3   Multiple Vitamin (MULTIVITAMIN) tablet Take 1 tablet by mouth daily.     rivaroxaban (XARELTO) 20 MG TABS tablet TAKE ONE TABLET BY MOUTH DAILY WITH SUPPER. LABS NEEDED FOR FURTHER REFILLS. 90 tablet 0   tamsulosin (FLOMAX) 0.4 MG CAPS capsule Take 0.4 mg by mouth.     No current  facility-administered medications for this encounter.    REVIEW OF SYSTEMS:  On review of systems, the patient reports that he is doing well overall. He denies any chest pain, shortness of breath, cough, fevers, chills, night sweats, unintended weight changes. He denies any bowel disturbances, and denies abdominal pain, nausea or vomiting. He denies any new musculoskeletal or joint aches or pains. His IPSS was 3, indicating mild urinary symptoms. His SHIM was 14, indicating he has moderate erectile dysfunction. A complete review of systems is obtained and is otherwise negative.    PHYSICAL EXAM:  Wt Readings from Last 3 Encounters:  02/23/20 (!) 370 lb (167.8 kg)  02/17/19 (!) 375 lb 6.4 oz (170.3 kg)  07/15/18 (!) 375 lb 12.8 oz (170.5 kg)   Temp Readings from Last 3 Encounters:  02/17/19 (!) 97.2 F (36.2 C)  09/01/17 98.9 F (37.2 C) (Oral)  10/28/15 98.3 F (36.8 C) (Oral)   BP Readings from Last 3 Encounters:  02/23/20 (!) 142/80  02/17/19 (!) 157/90  07/15/18 132/80   Pulse Readings from Last 3 Encounters:  02/23/20 75  02/17/19 68  07/15/18 77   Pain Assessment Pain Score: 0-No pain/10  In general this is a well appearing Caucasian man in no acute distress. He's alert and oriented x4 and appropriate throughout the examination. Cardiopulmonary assessment is negative for acute distress and he exhibits normal effort.    KPS = 90  100 - Normal; no complaints; no evidence of disease. 90   - Able to carry on normal activity; minor signs or symptoms of disease. 80   - Normal activity with effort; some signs or symptoms of disease. 70   - Cares for self; unable to carry on normal activity or to do active work. 60   - Requires occasional assistance, but is able to care for most of his personal needs. 50   - Requires considerable assistance and frequent medical care. 82   - Disabled; requires special care and assistance. 38   - Severely disabled; hospital admission is  indicated although death not imminent. 89   - Very sick; hospital admission necessary; active supportive treatment necessary. 10   - Moribund; fatal processes progressing rapidly. 0     - Dead  Karnofsky DA, Abelmann [place], Craver LS and Burchenal [facility] [phone]) The use of the nitrogen mustards in the palliative treatment of carcinoma: with particular reference to bronchogenic carcinoma Cancer 1 [phone]  LABORATORY DATA:  Lab Results  Component Value Date   WBC 8.4 07/15/2018   HGB 16.1 07/15/2018   HCT 48.8 07/15/2018   MCV 89 07/15/2018   PLT 265 07/15/2018  Lab Results  Component Value Date   NA 142 07/15/2018   K 4.5 07/15/2018   CL 101 07/15/2018   CO2 26 07/15/2018   Lab Results  Component Value Date   ALT 38 04/08/2013   AST 29 04/08/2013   ALKPHOS 63 04/08/2013   BILITOT 1.2 04/08/2013     RADIOGRAPHY: NM Bone Scan Whole Body  Result Date: 01/02/2021 CLINICAL DATA:  Prostate cancer. PSA is 13.10. No bone pain. No prior surgery, fracture, or recent injury. EXAM: NUCLEAR MEDICINE WHOLE BODY BONE SCAN TECHNIQUE: Whole body anterior and posterior images were obtained approximately 3 hours after intravenous injection of radiopharmaceutical. RADIOPHARMACEUTICALS:  Twenty-two mCi Technetium-99mMDP IV COMPARISON:  None. FINDINGS: Bilateral renal activity and bladder activity are present. There are focal areas of increased activity in the MEDIAL compartments of both knees, likely degenerative. Focal areas of increased activity also noted in the mid and LOWER thoracic spine and mid lumbar spine, also likely degenerative. IMPRESSION: No evidence for osseous metastatic disease. Electronically Signed   By: ENolon NationsM.D.   On: 01/02/2021 16:40      IMPRESSION/PLAN: This visit was conducted via MyChart to spare the patient unnecessary potential exposure in the healthcare setting during the current COVID-19 pandemic. 1. 75y.o. gentleman with Stage T1c adenocarcinoma of the prostate with  Gleason Score of 4+4, and PSA of 13.1. We discussed the patient's workup and outlined the nature of prostate cancer in this setting. The patient's T stage, Gleason's score, and PSA put him into the high risk group. Accordingly, he is eligible for a variety of potential treatment options including prostatectomy or LT-ADT in combination with 8 weeks of external radiation. We discussed the available radiation techniques, and focused on the details and logistics and delivery. The patient is not an ideal candidate for brachytherapy boost with a prostate volume of 105 cc. We discussed and outlined the risks, benefits, short and long-term effects associated with radiotherapy and compared and contrasted these with prostatectomy.  He is not felt to be an ideal surgical candidate given his body habitus.  We discussed the role of SpaceOAR in reducing the rectal toxicity associated with radiotherapy. We also detailed the role of ADT in the treatment of high risk prostate cancer and outlined the associated side effects that could be expected with this therapy.  He was encouraged to ask questions that were answered to his stated satisfaction.  At the end of the conversation the patient is interested in moving forward with 8 weeks of external beam therapy in combination with LT-ADT. We will share our discussion with Dr. BGloriann Loanand make arrangements for  a follow up visit to start ADT, first available.  We will also coordinate for fiducial markers and SpaceOAR gel placement in October 2022, prior to simulation, to reduce rectal toxicity from radiotherapy. The patient appears to have a good understanding of his disease and our treatment recommendations which are of curative intent and is in agreement with the stated plan.  Therefore, we will move forward with treatment planning accordingly, in anticipation of beginning IMRT approximately 2 months after starting ADT.   Given current concerns for patient exposure during the  COVID-19 pandemic, this encounter was conducted via video-enabled MyChart visit. The patient has given verbal consent for this type of encounter. The attendants for this meeting include MTyler PitaMD, Ashlyn Bruning PA-C, [place] and patient GSEGER GOUKER During the encounter, MTyler PitaMD, Ashlyn Bruning PA-C, and scribe, KWilburn Mylarwere  located at [facility] Radiation Oncology Department.  Patient MARQUET LAURO was located at home.   We personally spent 70 minutes in this encounter including chart review, reviewing radiological studies, meeting face-to-face with the patient, entering orders and completing documentation.   Nicholos Johns, PA-C    Tyler Pita, MD  [facility] Direct Dial: [phone]  Fax: [phone] [email or web address]  Skype  LinkedIn  This document serves as a record of services personally performed by Tyler Pita, MD and Freeman Caldron, PA-C. It was created on their behalf by Wilburn Mylar, a trained medical scribe. The creation of this record is based on the scribe's personal observations and the provider's statements to them. This document has been checked and approved by the attending provider.

## 2021-01-11 NOTE — Progress Notes (Signed)
Spoke with patient and wife via [place]. AUA score is 3 SHIM score is 14, distress score is 3. He has multiple questions concerning treatment options that informed him would be discussed by Ashlyn and Dr Tammi Klippel. Questions and concerns addressed. Patient denies any pain at this time. No weight loss no previous radiation no methotrexate no pacemeaker or ICD.

## 2021-01-12 ENCOUNTER — Telehealth: Payer: Self-pay | Admitting: *Deleted

## 2021-01-12 NOTE — Telephone Encounter (Signed)
CALLED PATIENT TO INFORM OF ADT FOR 02-08-21- ARRIVAL TIME- 9:45 AM @ DR. BELL'S OFFICE @ [facility], SPOKE WITH PATIENT AND HE IS AWARE OF THIS APPT.

## 2021-02-10 ENCOUNTER — Other Ambulatory Visit: Payer: Self-pay | Admitting: Cardiology

## 2021-02-10 ENCOUNTER — Telehealth: Payer: Self-pay | Admitting: *Deleted

## 2021-02-10 ENCOUNTER — Telehealth: Payer: Self-pay | Admitting: Cardiology

## 2021-02-10 DIAGNOSIS — I700 Atherosclerosis of aorta: Secondary | ICD-10-CM

## 2021-02-10 NOTE — Telephone Encounter (Signed)
   [facility] Pre-operative Risk Assessment    Patient Name: Stephen Francis  DOB: 30-Dec-1945 MRN: [id number]  HEARTCARE STAFF:  - IMPORTANT!!!!!! Under Visit Info/Reason for Call, type in Other and utilize the format Clearance MM/DD/YY or Clearance TBD. Do not use dashes or single digits. - Please review there is not already an duplicate clearance open for this procedure. - If request is for dental extraction, please clarify the # of teeth to be extracted. - If the patient is currently at the dentist's office, call Pre-Op Callback Staff (MA/nurse) to input urgent request.  - If the patient is not currently in the dentist office, please route to the Pre-Op pool.  Request for surgical clearance:  What type of surgery is being performed? gold seed implant  When is this surgery scheduled? 03/24/2021  What type of clearance is required (medical clearance vs. Pharmacy clearance to hold med vs. Both)? Pharmacy  Are there any medications that need to be held prior to surgery and how long? Xarelto, 72 hours prior  Practice name and name of physician performing surgery? [facility], Dr. Gloriann Loan  What is the office phone number? [phone]   7.   What is the office fax number? [phone]  8.   Anesthesia type (None, local, MAC, general) ? none   Selena Zobro 02/10/2021, 2:15 PM  _________________________________________________________________   (provider comments below)

## 2021-02-10 NOTE — Telephone Encounter (Signed)
CALLED PATIENT TO INFORM OF FID, MARKERS AND SPACE OAR TO BE PLACED @ [facility] ON 03-24-21 AND HIS SIM APPT. ON 03-29-21 - ARRIVAL TIME- 2:15 PM @ [place], SPOKE WITH PATIENT AND HE IS AWARE OF THESE APPTS.

## 2021-02-11 NOTE — Telephone Encounter (Signed)
   Name: Stephen Francis  DOB: [birth year]  MRN: [id number]  Primary Cardiologist: Minus Breeding, MD  Chart reviewed as part of pre-operative protocol coverage for gold seed implant on 03/24/2021. Because of Stephen Francis's past medical history and time since last visit, he will require a follow-up visit in order to better assess preoperative cardiovascular risk.  He is currently scheduled to see Dr. Percival Spanish 02/28/2021 for reassessment before his procedure on 10/27.    I will add preop clearance to the appointment notes, so that the provider is aware.   I will route this message to the pharmacy pool for input on holding anticoagulant/antiplatelet agent as requested below so that this information is available to the clearing provider at time of patient's appointment.   Pre-op covering staff: - Please contact requesting surgeon's office via preferred method (i.e, phone, fax) to inform them of need for appointment prior to surgery.  Arvil Chaco, PA-C  02/11/2021, 6:04 PM

## 2021-02-14 DIAGNOSIS — I700 Atherosclerosis of aorta: Secondary | ICD-10-CM | POA: Insufficient documentation

## 2021-02-14 NOTE — Telephone Encounter (Signed)
Pt has appt 02/28/21 with Dr. Percival Spanish. Will add pre op clearance to appt notes. Will forward notes to MD for upcoming appt. Will send FYI to surgeon's office pt has appt .

## 2021-02-14 NOTE — Telephone Encounter (Signed)
Patient with diagnosis of afib on Xarelto for anticoagulation.    Procedure: gold seed implant Date of procedure: 03/24/21  CHA2DS2-VASc Score = 4  This indicates a 4.8% annual risk of stroke. The patient's score is based upon: CHF History: 0 HTN History: 1 Diabetes History: 1 Stroke History: 0 Vascular Disease History: 1 (aortic atherosclerosis noted on abdominal CT 12/2020) Age Score: 1 Gender Score: 0  CrCl >166m/min Platelet count 302K  Assuming no new problems noted at upcoming visit with Dr HPercival Spanish(stroke, planned cardioversion etc), ok to hold Xarelto for 3 days prior to procedure as requested.

## 2021-02-15 ENCOUNTER — Other Ambulatory Visit: Payer: Self-pay | Admitting: *Deleted

## 2021-02-15 MED ORDER — DOFETILIDE 250 MCG PO CAPS: 250.0000 ug | ORAL_CAPSULE | Freq: Two times a day (BID) | ORAL | 3 refills | Status: DC

## 2021-02-18 ENCOUNTER — Telehealth: Payer: Self-pay | Admitting: *Deleted

## 2021-02-18 NOTE — Telephone Encounter (Signed)
PA for dofetilide complete and approved.

## 2021-02-27 NOTE — Progress Notes (Signed)
Cardiology Office Note   Date:  02/28/2021   ID:  Stephen, Francis 05-13-1946, MRN [id number]  PCP:  Cari Caraway, MD  Cardiologist:   Minus Breeding, MD    Chief Complaint  Patient presents with   Atrial Fibrillation       History of Present Illness: Stephen Francis is a 75 y.o. male who was seen by Dr. Wynonia Lawman  for follow atrial fib.  He was in the hospital in 2016 for Tikosyn for treatment of persistent atrial fib.    He is preop for radiation seed implants for prostate cancer.  He is in atrial fibrillation today which he noticed only because he takes his heart rate on a pulse oximeter.  He might get a little bit sweaty and little more fatigued when he is in fibrillation.  He has not noted any significantly increased heart rates however.  He does not have any real palpitations, presyncope or syncope.  He does not have any chest pressure, neck or arm discomfort.  He thinks he is in atrial fibrillation paroxysmally.   Past Medical History:  Diagnosis Date   Atrial fibrillation ([place])    Hyperlipidemia    Morbid obesity ([place])    Prostate cancer ([place])     Past Surgical History:  Procedure Laterality Date   CYST EXCISION     prostates biopsy     TONSILECTOMY, ADENOIDECTOMY, BILATERAL MYRINGOTOMY AND TUBES       Current Outpatient Medications  Medication Sig Dispense Refill   atorvastatin (LIPITOR) 20 MG tablet TAKE 1 TABLET EVERY OTHER  DAY 45 tablet 1   Cholecalciferol (VITAMIN D-3) 1000 UNITS CAPS Take 2,000 Units by mouth daily.      diltiazem (CARDIZEM CD) 180 MG 24 hr capsule TAKE 1 CAPSULE DAILY 90 capsule 1   dofetilide (TIKOSYN) 250 MCG capsule Take 1 capsule (250 mcg total) by mouth every 12 (twelve) hours. 180 capsule 3   fish oil-omega-3 fatty acids 1000 MG capsule Take 1 g by mouth 2 (two) times daily.      losartan (COZAAR) 100 MG tablet Take 1 tablet (100 mg total) by mouth daily. 90 tablet 3   metoprolol tartrate (LOPRESSOR) 50 MG tablet TAKE  ONE AND ONE-HALF      TABLETS TWICE A DAY 270 tablet 3   Multiple Vitamin (MULTIVITAMIN) tablet Take 1 tablet by mouth daily.     rivaroxaban (XARELTO) 20 MG TABS tablet TAKE ONE TABLET BY MOUTH DAILY WITH SUPPER. LABS NEEDED FOR FURTHER REFILLS. 90 tablet 0   tamsulosin (FLOMAX) 0.4 MG CAPS capsule Take 0.4 mg by mouth.     No current facility-administered medications for this visit.    Allergies:   Niacin and related    ROS:  Please see the history of present illness.   Otherwise, review of systems are positive for none.   All other systems are reviewed and negative.    PHYSICAL EXAM: VS:  BP 140/88   Pulse 94   Ht 6\' 2"  (1.88 m)   Wt (!) 360 lb (163.3 kg)   SpO2 96%   BMI 46.22 kg/m  , BMI Body mass index is 46.22 kg/m. GENERAL:  Well appearing NECK:  No jugular venous distention, waveform within normal limits, carotid upstroke brisk and symmetric, no bruits, no thyromegaly LUNGS:  Clear to auscultation bilaterally CHEST:  Unremarkable HEART:  PMI not displaced or sustained,S1 and S2 within normal limits, no S3, no clicks, no rubs, no murmurs, irregular  ABD:  Flat, positive bowel sounds normal in frequency in pitch, no bruits, no rebound, no guarding, no midline pulsatile mass, no hepatomegaly, no splenomegaly EXT:  2 plus pulses throughout, no edema, no cyanosis no clubbing   EKG:  EKG is  ordered today. The ekg ordered today demonstrates sinus rhythm, rate 94, nonspecific lateral T wave inversions, no acute ST-T wave changes.  Of note the QTc calculated to be 488 with my hand calculation   Recent Labs: No results found for requested labs within last 8760 hours.    Lipid Panel    Component Value Date/Time   CHOL 205 (H) 10/28/2015 0844   TRIG 238 (H) 10/28/2015 0844   HDL 27 (L) 10/28/2015 0844   CHOLHDL 7.6 (H) 10/28/2015 0844   VLDL 48 (H) 10/28/2015 0844   LDLCALC 130 (H) 10/28/2015 0844      Wt Readings from Last 3 Encounters:  02/28/21 (!) 360 lb (163.3  kg)  02/23/20 (!) 370 lb (167.8 kg)  02/17/19 (!) 375 lb 6.4 oz (170.3 kg)      Other studies Reviewed: Additional studies/ records that were reviewed today include: Labs. Review of the above records demonstrates:  Please see elsewhere in the note.     ASSESSMENT AND PLAN:  ATRIAL FIB:   [name] has a CHA2DS2 - VASc score of 3.    He does have paroxysms and is in fibrillation today but does not really notice it.  He tolerates his anticoagulation.  He will continue with his Tikosyn.   QTC is mildly prolonged but stable from previous.  It is somewhat difficult to calculate because of the T waves being flat  MORBID OBESITY:   We have discussed this at length previously.   DM: Hemoglobin A1c was 6.3.  This is down from 6.6.  No change in therapy   HYPERTENSIVE HEART DISEASE:   Upper limits but he is mildly anxious today because of his upcoming procedure.  No change in therapy.   PREOP: The patient has no high risk symptoms or features.  He is not going for high risk procedure.  Therefore, according to ACC/AHA guidelines no further cardiovascular testing is suggested.  He can hold his anticoagulation is needed for the procedure.  Stopping his Xarelto 3 days before would not be unreasonable.   Current medicines are reviewed at length with the patient today.  The patient does not have concerns regarding medicines.  The following changes have been made: None  Labs/ tests ordered today include: None  Orders Placed This Encounter  Procedures   EKG 12-Lead      Disposition:   FU with me in 12 months.     Signed, Minus Breeding, MD  02/28/2021 9:45 AM    [place]

## 2021-02-28 ENCOUNTER — Other Ambulatory Visit: Payer: Self-pay

## 2021-02-28 ENCOUNTER — Encounter: Payer: Self-pay | Admitting: Cardiology

## 2021-02-28 ENCOUNTER — Ambulatory Visit (INDEPENDENT_AMBULATORY_CARE_PROVIDER_SITE_OTHER): Payer: Medicare Other | Admitting: Cardiology

## 2021-02-28 VITALS — BP 140/88 | HR 94 | Ht 74.0 in | Wt 360.0 lb

## 2021-02-28 DIAGNOSIS — Z01810 Encounter for preprocedural cardiovascular examination: Secondary | ICD-10-CM

## 2021-02-28 DIAGNOSIS — E118 Type 2 diabetes mellitus with unspecified complications: Secondary | ICD-10-CM

## 2021-02-28 DIAGNOSIS — I480 Paroxysmal atrial fibrillation: Secondary | ICD-10-CM | POA: Diagnosis not present

## 2021-02-28 NOTE — Patient Instructions (Signed)
Medication Instructions:  Your Physician recommend you continue on your current medication as directed.    *If you need a refill on your cardiac medications before your next appointment, please call your pharmacy*   Lab Work: None ordered today   Testing/Procedures: None ordered today   Follow-Up: At [facility], you and your health needs are our priority.  As part of our continuing mission to provide you with exceptional heart care, we have created designated Provider Care Teams.  These Care Teams include your primary Cardiologist (physician) and Advanced Practice Providers (APPs -  Physician Assistants and Nurse Practitioners) who all work together to provide you with the care you need, when you need it.  We recommend signing up for the patient portal called "MyChart".  Sign up information is provided on this After Visit Summary.  MyChart is used to connect with patients for Virtual Visits (Telemedicine).  Patients are able to view lab/test results, encounter notes, upcoming appointments, etc.  Non-urgent messages can be sent to your provider as well.   To learn more about what you can do with MyChart, go to [email or web address].    Your next appointment:   1 year(s)  The format for your next appointment:   In Person  Provider:   James Hochrein, MD {        

## 2021-03-02 NOTE — Telephone Encounter (Signed)
   Patient Name: Stephen Francis  DOB: 1945/10/16 MRN: [id number]  Primary Cardiologist: Minus Breeding, MD  Chart reviewed as part of pre-operative protocol coverage. Preop clearance already addressed in OV 02/28/21. Will route this msg and Dr. Rosezella [place] OV to requesting party below and remove from preop box.   Charlie Pitter, PA-C 03/02/2021, 10:38 AM

## 2021-03-25 ENCOUNTER — Telehealth: Payer: Self-pay | Admitting: *Deleted

## 2021-03-25 NOTE — Telephone Encounter (Signed)
CALLED PATIENT TO REMIND OF SIM APPT. FOR 03-29-21- ARRIVAL TIME- 2:15 PM @ [place], SPOKE WITH PATIENT AND HE IS AWARE OF THIS APPT.

## 2021-03-29 ENCOUNTER — Ambulatory Visit: Payer: Medicare Other | Admitting: Radiation Oncology

## 2021-04-04 ENCOUNTER — Telehealth: Payer: Self-pay | Admitting: *Deleted

## 2021-04-04 NOTE — Telephone Encounter (Signed)
CALLED PATIENT TO REMIND OF SIM APPT. FOR 04-05-21- ARRIVAL TIME- 2:15 PM @ [place], SPOKE WITH PATIENT AND HE IS AWARE OF THIS APPT.

## 2021-04-05 ENCOUNTER — Ambulatory Visit
Admission: RE | Admit: 2021-04-05 | Discharge: 2021-04-05 | Disposition: A | Discharge status: Home / Self Care | Payer: Medicare Other | Source: Ambulatory Visit | Attending: Radiation Oncology | Admitting: Radiation Oncology

## 2021-04-05 ENCOUNTER — Other Ambulatory Visit: Payer: Self-pay

## 2021-04-05 DIAGNOSIS — Z510 Encounter for antineoplastic radiation therapy: Secondary | ICD-10-CM | POA: Diagnosis not present

## 2021-04-05 DIAGNOSIS — C61 Malignant neoplasm of prostate: Secondary | ICD-10-CM | POA: Diagnosis present

## 2021-04-07 DIAGNOSIS — Z510 Encounter for antineoplastic radiation therapy: Secondary | ICD-10-CM | POA: Diagnosis not present

## 2021-04-07 NOTE — Progress Notes (Signed)
  Radiation Oncology         [phone]) [phone] ________________________________  Name: Stephen Francis MRN: [id number]  Date: 04/05/2021  DOB: Mar 26, 1946  SIMULATION AND TREATMENT PLANNING NOTE    ICD-10-CM   1. Malignant neoplasm of prostate ([place])  C61       DIAGNOSIS:  75 y.o. gentleman with Stage T1c adenocarcinoma of the prostate with Gleason score of 4+4, and PSA of 13.1.  NARRATIVE:  The patient was brought to the [facility].  Identity was confirmed.  All relevant records and images related to the planned course of therapy were reviewed.  The patient freely provided informed written consent to proceed with treatment after reviewing the details related to the planned course of therapy. The consent form was witnessed and verified by the simulation staff.  Then, the patient was set-up in a stable reproducible supine position for radiation therapy.  A vacuum lock pillow device was custom fabricated to position his legs in a reproducible immobilized position.  Then, I performed a urethrogram under sterile conditions to identify the prostatic bed.  CT images were obtained.  Surface markings were placed.  The CT images were loaded into the planning software.  Then the prostate bed target, pelvic lymph node target and avoidance structures including the rectum, bladder, bowel and hips were contoured.  Treatment planning then occurred.  The radiation prescription was entered and confirmed.  A total of one complex treatment devices were fabricated. I have requested : Intensity Modulated Radiotherapy (IMRT) is medically necessary for this case for the following reason:  Rectal sparing.Marland Kitchen  PLAN:  The patient will receive 45 Gy in 25 fractions of 1.8 Gy, followed by a boost to the prostate to a total dose of 75 Gy with 15 additional fractions of 2 Gy.   ________________________________  Sheral Apley Tammi Klippel, M.D.

## 2021-04-11 ENCOUNTER — Ambulatory Visit: Payer: Medicare Other

## 2021-04-12 ENCOUNTER — Ambulatory Visit: Payer: Medicare Other

## 2021-04-13 ENCOUNTER — Ambulatory Visit: Payer: Medicare Other

## 2021-04-14 ENCOUNTER — Other Ambulatory Visit: Payer: Self-pay

## 2021-04-14 ENCOUNTER — Ambulatory Visit
Admission: RE | Admit: 2021-04-14 | Discharge: 2021-04-14 | Disposition: A | Discharge status: Home / Self Care | Payer: Medicare Other | Source: Ambulatory Visit | Attending: Radiation Oncology | Admitting: Radiation Oncology

## 2021-04-14 DIAGNOSIS — Z510 Encounter for antineoplastic radiation therapy: Secondary | ICD-10-CM | POA: Diagnosis not present

## 2021-04-15 ENCOUNTER — Ambulatory Visit
Admission: RE | Admit: 2021-04-15 | Discharge: 2021-04-15 | Disposition: A | Discharge status: Home / Self Care | Payer: Medicare Other | Source: Ambulatory Visit | Attending: Radiation Oncology | Admitting: Radiation Oncology

## 2021-04-15 DIAGNOSIS — Z510 Encounter for antineoplastic radiation therapy: Secondary | ICD-10-CM | POA: Diagnosis not present

## 2021-04-18 ENCOUNTER — Ambulatory Visit: Payer: Medicare Other

## 2021-04-19 ENCOUNTER — Ambulatory Visit: Payer: Medicare Other

## 2021-04-20 ENCOUNTER — Ambulatory Visit: Payer: Medicare Other

## 2021-04-25 ENCOUNTER — Other Ambulatory Visit: Payer: Self-pay

## 2021-04-25 ENCOUNTER — Ambulatory Visit
Admission: RE | Admit: 2021-04-25 | Discharge: 2021-04-25 | Disposition: A | Discharge status: Home / Self Care | Payer: Medicare Other | Source: Ambulatory Visit | Attending: Radiation Oncology | Admitting: Radiation Oncology

## 2021-04-25 DIAGNOSIS — Z510 Encounter for antineoplastic radiation therapy: Secondary | ICD-10-CM | POA: Diagnosis not present

## 2021-04-26 ENCOUNTER — Ambulatory Visit
Admission: RE | Admit: 2021-04-26 | Discharge: 2021-04-26 | Disposition: A | Discharge status: Home / Self Care | Payer: Medicare Other | Source: Ambulatory Visit | Attending: Radiation Oncology | Admitting: Radiation Oncology

## 2021-04-26 DIAGNOSIS — Z510 Encounter for antineoplastic radiation therapy: Secondary | ICD-10-CM | POA: Diagnosis not present

## 2021-04-27 ENCOUNTER — Ambulatory Visit
Admission: RE | Admit: 2021-04-27 | Discharge: 2021-04-27 | Disposition: A | Discharge status: Home / Self Care | Payer: Medicare Other | Source: Ambulatory Visit | Attending: Radiation Oncology | Admitting: Radiation Oncology

## 2021-04-27 ENCOUNTER — Other Ambulatory Visit: Payer: Self-pay

## 2021-04-27 DIAGNOSIS — Z510 Encounter for antineoplastic radiation therapy: Secondary | ICD-10-CM | POA: Diagnosis not present

## 2021-04-28 ENCOUNTER — Ambulatory Visit
Admission: RE | Admit: 2021-04-28 | Discharge: 2021-04-28 | Disposition: A | Discharge status: Home / Self Care | Payer: Medicare Other | Source: Ambulatory Visit | Attending: Radiation Oncology | Admitting: Radiation Oncology

## 2021-04-28 DIAGNOSIS — C61 Malignant neoplasm of prostate: Secondary | ICD-10-CM | POA: Insufficient documentation

## 2021-04-28 DIAGNOSIS — Z510 Encounter for antineoplastic radiation therapy: Secondary | ICD-10-CM | POA: Insufficient documentation

## 2021-04-29 ENCOUNTER — Other Ambulatory Visit: Payer: Self-pay

## 2021-04-29 ENCOUNTER — Ambulatory Visit
Admission: RE | Admit: 2021-04-29 | Discharge: 2021-04-29 | Disposition: A | Discharge status: Home / Self Care | Payer: Medicare Other | Source: Ambulatory Visit | Attending: Radiation Oncology | Admitting: Radiation Oncology

## 2021-04-29 DIAGNOSIS — Z510 Encounter for antineoplastic radiation therapy: Secondary | ICD-10-CM | POA: Diagnosis not present

## 2021-05-02 ENCOUNTER — Other Ambulatory Visit: Payer: Self-pay

## 2021-05-02 ENCOUNTER — Ambulatory Visit
Admission: RE | Admit: 2021-05-02 | Discharge: 2021-05-02 | Disposition: A | Discharge status: Home / Self Care | Payer: Medicare Other | Source: Ambulatory Visit | Attending: Radiation Oncology | Admitting: Radiation Oncology

## 2021-05-02 DIAGNOSIS — Z510 Encounter for antineoplastic radiation therapy: Secondary | ICD-10-CM | POA: Diagnosis not present

## 2021-05-03 ENCOUNTER — Ambulatory Visit
Admission: RE | Admit: 2021-05-03 | Discharge: 2021-05-03 | Disposition: A | Discharge status: Home / Self Care | Payer: Medicare Other | Source: Ambulatory Visit | Attending: Radiation Oncology | Admitting: Radiation Oncology

## 2021-05-03 DIAGNOSIS — Z510 Encounter for antineoplastic radiation therapy: Secondary | ICD-10-CM | POA: Diagnosis not present

## 2021-05-04 ENCOUNTER — Ambulatory Visit
Admission: RE | Admit: 2021-05-04 | Discharge: 2021-05-04 | Disposition: A | Discharge status: Home / Self Care | Payer: Medicare Other | Source: Ambulatory Visit | Attending: Radiation Oncology | Admitting: Radiation Oncology

## 2021-05-04 ENCOUNTER — Other Ambulatory Visit: Payer: Self-pay

## 2021-05-04 DIAGNOSIS — Z510 Encounter for antineoplastic radiation therapy: Secondary | ICD-10-CM | POA: Diagnosis not present

## 2021-05-05 ENCOUNTER — Ambulatory Visit
Admission: RE | Admit: 2021-05-05 | Discharge: 2021-05-05 | Disposition: A | Discharge status: Home / Self Care | Payer: Medicare Other | Source: Ambulatory Visit | Attending: Radiation Oncology | Admitting: Radiation Oncology

## 2021-05-05 DIAGNOSIS — Z510 Encounter for antineoplastic radiation therapy: Secondary | ICD-10-CM | POA: Diagnosis not present

## 2021-05-06 ENCOUNTER — Other Ambulatory Visit: Payer: Self-pay

## 2021-05-06 ENCOUNTER — Other Ambulatory Visit: Payer: Self-pay | Admitting: Radiation Oncology

## 2021-05-06 ENCOUNTER — Ambulatory Visit
Admission: RE | Admit: 2021-05-06 | Discharge: 2021-05-06 | Disposition: A | Discharge status: Home / Self Care | Payer: Medicare Other | Source: Ambulatory Visit | Attending: Radiation Oncology | Admitting: Radiation Oncology

## 2021-05-06 DIAGNOSIS — Z510 Encounter for antineoplastic radiation therapy: Secondary | ICD-10-CM | POA: Diagnosis not present

## 2021-05-06 MED ORDER — TAMSULOSIN HCL 0.4 MG PO CAPS: 0.8000 mg | ORAL_CAPSULE | Freq: Every day | ORAL | 3 refills | Status: AC

## 2021-05-09 ENCOUNTER — Other Ambulatory Visit: Payer: Self-pay

## 2021-05-09 ENCOUNTER — Ambulatory Visit
Admission: RE | Admit: 2021-05-09 | Discharge: 2021-05-09 | Disposition: A | Discharge status: Home / Self Care | Payer: Medicare Other | Source: Ambulatory Visit | Attending: Radiation Oncology | Admitting: Radiation Oncology

## 2021-05-09 DIAGNOSIS — Z510 Encounter for antineoplastic radiation therapy: Secondary | ICD-10-CM | POA: Diagnosis not present

## 2021-05-10 ENCOUNTER — Ambulatory Visit
Admission: RE | Admit: 2021-05-10 | Discharge: 2021-05-10 | Disposition: A | Discharge status: Home / Self Care | Payer: Medicare Other | Source: Ambulatory Visit | Attending: Radiation Oncology | Admitting: Radiation Oncology

## 2021-05-10 DIAGNOSIS — Z510 Encounter for antineoplastic radiation therapy: Secondary | ICD-10-CM | POA: Diagnosis not present

## 2021-05-11 ENCOUNTER — Ambulatory Visit
Admission: RE | Admit: 2021-05-11 | Discharge: 2021-05-11 | Disposition: A | Discharge status: Home / Self Care | Payer: Medicare Other | Source: Ambulatory Visit | Attending: Radiation Oncology | Admitting: Radiation Oncology

## 2021-05-11 DIAGNOSIS — Z510 Encounter for antineoplastic radiation therapy: Secondary | ICD-10-CM | POA: Diagnosis not present

## 2021-05-12 ENCOUNTER — Ambulatory Visit
Admission: RE | Admit: 2021-05-12 | Discharge: 2021-05-12 | Disposition: A | Discharge status: Home / Self Care | Payer: Medicare Other | Source: Ambulatory Visit | Attending: Radiation Oncology | Admitting: Radiation Oncology

## 2021-05-12 DIAGNOSIS — Z510 Encounter for antineoplastic radiation therapy: Secondary | ICD-10-CM | POA: Diagnosis not present

## 2021-05-13 ENCOUNTER — Ambulatory Visit
Admission: RE | Admit: 2021-05-13 | Discharge: 2021-05-13 | Disposition: A | Discharge status: Home / Self Care | Payer: Medicare Other | Source: Ambulatory Visit | Attending: Radiation Oncology | Admitting: Radiation Oncology

## 2021-05-13 DIAGNOSIS — Z510 Encounter for antineoplastic radiation therapy: Secondary | ICD-10-CM | POA: Diagnosis not present

## 2021-05-16 ENCOUNTER — Ambulatory Visit
Admission: RE | Admit: 2021-05-16 | Discharge: 2021-05-16 | Disposition: A | Discharge status: Home / Self Care | Payer: Medicare Other | Source: Ambulatory Visit | Attending: Radiation Oncology | Admitting: Radiation Oncology

## 2021-05-16 ENCOUNTER — Other Ambulatory Visit: Payer: Self-pay

## 2021-05-16 DIAGNOSIS — Z510 Encounter for antineoplastic radiation therapy: Secondary | ICD-10-CM | POA: Diagnosis not present

## 2021-05-17 ENCOUNTER — Ambulatory Visit
Admission: RE | Admit: 2021-05-17 | Discharge: 2021-05-17 | Disposition: A | Discharge status: Home / Self Care | Payer: Medicare Other | Source: Ambulatory Visit | Attending: Radiation Oncology | Admitting: Radiation Oncology

## 2021-05-17 DIAGNOSIS — Z510 Encounter for antineoplastic radiation therapy: Secondary | ICD-10-CM | POA: Diagnosis not present

## 2021-05-18 ENCOUNTER — Ambulatory Visit
Admission: RE | Admit: 2021-05-18 | Discharge: 2021-05-18 | Disposition: A | Discharge status: Home / Self Care | Payer: Medicare Other | Source: Ambulatory Visit | Attending: Radiation Oncology | Admitting: Radiation Oncology

## 2021-05-18 ENCOUNTER — Other Ambulatory Visit: Payer: Self-pay

## 2021-05-18 DIAGNOSIS — Z510 Encounter for antineoplastic radiation therapy: Secondary | ICD-10-CM | POA: Diagnosis not present

## 2021-05-19 ENCOUNTER — Ambulatory Visit
Admission: RE | Admit: 2021-05-19 | Discharge: 2021-05-19 | Disposition: A | Discharge status: Home / Self Care | Payer: Medicare Other | Source: Ambulatory Visit | Attending: Radiation Oncology | Admitting: Radiation Oncology

## 2021-05-19 DIAGNOSIS — Z510 Encounter for antineoplastic radiation therapy: Secondary | ICD-10-CM | POA: Diagnosis not present

## 2021-05-20 ENCOUNTER — Other Ambulatory Visit: Payer: Self-pay

## 2021-05-20 ENCOUNTER — Ambulatory Visit
Admission: RE | Admit: 2021-05-20 | Discharge: 2021-05-20 | Disposition: A | Discharge status: Home / Self Care | Payer: Medicare Other | Source: Ambulatory Visit | Attending: Radiation Oncology | Admitting: Radiation Oncology

## 2021-05-20 DIAGNOSIS — Z510 Encounter for antineoplastic radiation therapy: Secondary | ICD-10-CM | POA: Diagnosis not present

## 2021-05-24 ENCOUNTER — Ambulatory Visit: Payer: Medicare Other

## 2021-05-24 ENCOUNTER — Other Ambulatory Visit: Payer: Self-pay

## 2021-05-24 DIAGNOSIS — Z510 Encounter for antineoplastic radiation therapy: Secondary | ICD-10-CM | POA: Diagnosis not present

## 2021-05-25 ENCOUNTER — Ambulatory Visit: Payer: Medicare Other

## 2021-05-25 DIAGNOSIS — Z510 Encounter for antineoplastic radiation therapy: Secondary | ICD-10-CM | POA: Diagnosis not present

## 2021-05-26 ENCOUNTER — Ambulatory Visit
Admission: RE | Admit: 2021-05-26 | Discharge: 2021-05-26 | Disposition: A | Discharge status: Home / Self Care | Payer: Medicare Other | Source: Ambulatory Visit | Attending: Radiation Oncology | Admitting: Radiation Oncology

## 2021-05-26 ENCOUNTER — Other Ambulatory Visit: Payer: Self-pay

## 2021-05-26 DIAGNOSIS — Z510 Encounter for antineoplastic radiation therapy: Secondary | ICD-10-CM | POA: Diagnosis not present

## 2021-05-27 ENCOUNTER — Ambulatory Visit
Admission: RE | Admit: 2021-05-27 | Discharge: 2021-05-27 | Disposition: A | Discharge status: Home / Self Care | Payer: Medicare Other | Source: Ambulatory Visit | Attending: Radiation Oncology | Admitting: Radiation Oncology

## 2021-05-27 DIAGNOSIS — Z510 Encounter for antineoplastic radiation therapy: Secondary | ICD-10-CM | POA: Diagnosis not present

## 2021-05-28 ENCOUNTER — Other Ambulatory Visit: Payer: Self-pay | Admitting: Cardiology

## 2021-05-31 ENCOUNTER — Ambulatory Visit
Admission: RE | Admit: 2021-05-31 | Discharge: 2021-05-31 | Disposition: A | Discharge status: Home / Self Care | Payer: Medicare Other | Source: Ambulatory Visit | Attending: Radiation Oncology | Admitting: Radiation Oncology

## 2021-05-31 ENCOUNTER — Other Ambulatory Visit: Payer: Self-pay

## 2021-05-31 DIAGNOSIS — Z510 Encounter for antineoplastic radiation therapy: Secondary | ICD-10-CM | POA: Insufficient documentation

## 2021-05-31 DIAGNOSIS — C61 Malignant neoplasm of prostate: Secondary | ICD-10-CM | POA: Insufficient documentation

## 2021-06-01 ENCOUNTER — Ambulatory Visit
Admission: RE | Admit: 2021-06-01 | Discharge: 2021-06-01 | Disposition: A | Discharge status: Home / Self Care | Payer: Medicare Other | Source: Ambulatory Visit | Attending: Radiation Oncology | Admitting: Radiation Oncology

## 2021-06-01 DIAGNOSIS — Z510 Encounter for antineoplastic radiation therapy: Secondary | ICD-10-CM | POA: Diagnosis not present

## 2021-06-02 ENCOUNTER — Other Ambulatory Visit: Payer: Self-pay

## 2021-06-02 ENCOUNTER — Ambulatory Visit
Admission: RE | Admit: 2021-06-02 | Discharge: 2021-06-02 | Disposition: A | Discharge status: Home / Self Care | Payer: Medicare Other | Source: Ambulatory Visit | Attending: Radiation Oncology | Admitting: Radiation Oncology

## 2021-06-02 DIAGNOSIS — Z510 Encounter for antineoplastic radiation therapy: Secondary | ICD-10-CM | POA: Diagnosis not present

## 2021-06-03 ENCOUNTER — Ambulatory Visit
Admission: RE | Admit: 2021-06-03 | Discharge: 2021-06-03 | Disposition: A | Discharge status: Home / Self Care | Payer: Medicare Other | Source: Ambulatory Visit | Attending: Radiation Oncology | Admitting: Radiation Oncology

## 2021-06-03 ENCOUNTER — Other Ambulatory Visit: Payer: Self-pay | Admitting: Cardiology

## 2021-06-03 DIAGNOSIS — Z510 Encounter for antineoplastic radiation therapy: Secondary | ICD-10-CM | POA: Diagnosis not present

## 2021-06-03 NOTE — Telephone Encounter (Signed)
Prescription refill request for Xarelto received.  Indication:Afib Last office visit:10/22 Weight:163.3 kg Age:76 Scr:0.8 CrCl:187.11 ml/min  Prescription refilled

## 2021-06-06 ENCOUNTER — Other Ambulatory Visit: Payer: Self-pay

## 2021-06-06 ENCOUNTER — Ambulatory Visit
Admission: RE | Admit: 2021-06-06 | Discharge: 2021-06-06 | Disposition: A | Discharge status: Home / Self Care | Payer: Medicare Other | Source: Ambulatory Visit | Attending: Radiation Oncology | Admitting: Radiation Oncology

## 2021-06-06 DIAGNOSIS — Z510 Encounter for antineoplastic radiation therapy: Secondary | ICD-10-CM | POA: Diagnosis not present

## 2021-06-07 ENCOUNTER — Ambulatory Visit
Admission: RE | Admit: 2021-06-07 | Discharge: 2021-06-07 | Disposition: A | Discharge status: Home / Self Care | Payer: Medicare Other | Source: Ambulatory Visit | Attending: Radiation Oncology | Admitting: Radiation Oncology

## 2021-06-07 DIAGNOSIS — Z510 Encounter for antineoplastic radiation therapy: Secondary | ICD-10-CM | POA: Diagnosis not present

## 2021-06-08 ENCOUNTER — Ambulatory Visit
Admission: RE | Admit: 2021-06-08 | Discharge: 2021-06-08 | Disposition: A | Discharge status: Home / Self Care | Payer: Medicare Other | Source: Ambulatory Visit | Attending: Radiation Oncology | Admitting: Radiation Oncology

## 2021-06-08 ENCOUNTER — Other Ambulatory Visit: Payer: Self-pay

## 2021-06-08 DIAGNOSIS — Z510 Encounter for antineoplastic radiation therapy: Secondary | ICD-10-CM | POA: Diagnosis not present

## 2021-06-09 ENCOUNTER — Ambulatory Visit
Admission: RE | Admit: 2021-06-09 | Discharge: 2021-06-09 | Disposition: A | Discharge status: Home / Self Care | Payer: Medicare Other | Source: Ambulatory Visit | Attending: Radiation Oncology | Admitting: Radiation Oncology

## 2021-06-09 ENCOUNTER — Ambulatory Visit: Payer: Medicare Other

## 2021-06-09 DIAGNOSIS — Z510 Encounter for antineoplastic radiation therapy: Secondary | ICD-10-CM | POA: Diagnosis not present

## 2021-06-10 ENCOUNTER — Other Ambulatory Visit: Payer: Self-pay

## 2021-06-10 ENCOUNTER — Ambulatory Visit
Admission: RE | Admit: 2021-06-10 | Discharge: 2021-06-10 | Disposition: A | Discharge status: Home / Self Care | Payer: Medicare Other | Source: Ambulatory Visit | Attending: Radiation Oncology | Admitting: Radiation Oncology

## 2021-06-10 DIAGNOSIS — Z510 Encounter for antineoplastic radiation therapy: Secondary | ICD-10-CM | POA: Diagnosis not present

## 2021-06-13 ENCOUNTER — Ambulatory Visit
Admission: RE | Admit: 2021-06-13 | Discharge: 2021-06-13 | Disposition: A | Discharge status: Home / Self Care | Payer: Medicare Other | Source: Ambulatory Visit | Attending: Radiation Oncology | Admitting: Radiation Oncology

## 2021-06-13 ENCOUNTER — Other Ambulatory Visit: Payer: Self-pay

## 2021-06-13 DIAGNOSIS — Z510 Encounter for antineoplastic radiation therapy: Secondary | ICD-10-CM | POA: Diagnosis not present

## 2021-06-14 ENCOUNTER — Ambulatory Visit: Payer: Medicare Other

## 2021-06-14 ENCOUNTER — Ambulatory Visit
Admission: RE | Admit: 2021-06-14 | Discharge: 2021-06-14 | Disposition: A | Discharge status: Home / Self Care | Payer: Medicare Other | Source: Ambulatory Visit | Attending: Radiation Oncology | Admitting: Radiation Oncology

## 2021-06-14 DIAGNOSIS — Z510 Encounter for antineoplastic radiation therapy: Secondary | ICD-10-CM | POA: Diagnosis not present

## 2021-06-15 ENCOUNTER — Ambulatory Visit: Payer: Medicare Other

## 2021-06-15 ENCOUNTER — Ambulatory Visit
Admission: RE | Admit: 2021-06-15 | Discharge: 2021-06-15 | Disposition: A | Discharge status: Home / Self Care | Payer: Medicare Other | Source: Ambulatory Visit | Attending: Radiation Oncology | Admitting: Radiation Oncology

## 2021-06-15 DIAGNOSIS — Z510 Encounter for antineoplastic radiation therapy: Secondary | ICD-10-CM | POA: Diagnosis not present

## 2021-06-16 ENCOUNTER — Ambulatory Visit
Admission: RE | Admit: 2021-06-16 | Discharge: 2021-06-16 | Disposition: A | Discharge status: Home / Self Care | Payer: Medicare Other | Source: Ambulatory Visit | Attending: Radiation Oncology | Admitting: Radiation Oncology

## 2021-06-16 DIAGNOSIS — Z510 Encounter for antineoplastic radiation therapy: Secondary | ICD-10-CM | POA: Diagnosis not present

## 2021-06-17 ENCOUNTER — Ambulatory Visit
Admission: RE | Admit: 2021-06-17 | Discharge: 2021-06-17 | Disposition: A | Discharge status: Home / Self Care | Payer: Medicare Other | Source: Ambulatory Visit | Attending: Radiation Oncology | Admitting: Radiation Oncology

## 2021-06-17 ENCOUNTER — Encounter: Payer: Self-pay | Admitting: Urology

## 2021-06-17 ENCOUNTER — Other Ambulatory Visit: Payer: Self-pay

## 2021-06-17 DIAGNOSIS — C61 Malignant neoplasm of prostate: Secondary | ICD-10-CM

## 2021-06-17 DIAGNOSIS — Z510 Encounter for antineoplastic radiation therapy: Secondary | ICD-10-CM | POA: Diagnosis not present

## 2021-07-07 ENCOUNTER — Ambulatory Visit: Payer: Self-pay | Admitting: Urology

## 2021-07-20 ENCOUNTER — Encounter: Payer: Self-pay | Admitting: Urology

## 2021-07-20 NOTE — Progress Notes (Signed)
Spoke w/ patient, verified identity, and began nursing interview. Patient reports polyuria/ nocturia. No other issues reported at this time.  Meaningful use complete. I-PSS score of 8 (moderate). Currently on Flomax 0.4mg  as directed. Urology follow-up April 2023 -per patient  Patient notified of his 10:30am-07/21/21 telephone appointment w/ Ashlyn Bruning PA-C. I left my extension [phone] in case patient needs to call. Patient verbalized understanding of information.  Patient contact [phone]

## 2021-07-21 ENCOUNTER — Ambulatory Visit
Admission: RE | Admit: 2021-07-21 | Discharge: 2021-07-21 | Disposition: A | Discharge status: Home / Self Care | Payer: Medicare Other | Source: Ambulatory Visit | Attending: Urology | Admitting: Urology

## 2021-07-21 DIAGNOSIS — C61 Malignant neoplasm of prostate: Secondary | ICD-10-CM

## 2021-07-21 NOTE — Progress Notes (Signed)
Radiation Oncology         [phone]) [phone] ________________________________  Name: Stephen Francis MRN: [id number]  Date: 07/21/2021  DOB: 29-May-1946  Post Treatment Note  CC: Cari Caraway, MD  Lucas Mallow, MD  Diagnosis:   76 y.o. gentleman with Stage T1c adenocarcinoma of the prostate with Gleason score of 4+4, and PSA of 13.1.     Interval Since Last Radiation:  5 weeks, concurrent with ADT Mills Koller given 02/08/2021 and a 52-month Eligard injection 03/01/2021) 04/14/21 - 06/17/21:  1. The prostate, seminal vesicles, and pelvic lymph nodes were initially treated to 45 Gy in 25 fractions of 1.8 Gy  2. The prostate only was boosted to 75 Gy with 15 additional fractions of 2.0 Gy   Narrative:  I spoke with the patient to conduct his routine scheduled 1 month follow up visit via telephone to spare the patient unnecessary potential exposure in the healthcare setting during the current COVID-19 pandemic.  The patient was notified in advance and gave permission to proceed with this visit format.    He tolerated radiation treatment relatively well with only minor urinary irritation and modest fatigue.  He did report nocturia 4-6 times per night as well as increased frequency and urgency despite taking Flomax daily as prescribed.  He also experienced some mild diarrhea but did not require medical management.                              On review of systems, the patient states that he is doing well in general.  He reports gradual improvement in the LUTS which are now quite manageable.  The diarrhea has completely resolved and currently, he denies abdominal pain, nausea, vomiting, diarrhea or constipation.  He reports a healthy appetite and is maintaining his weight.  He specifically denies dysuria, gross hematuria, straining to void, incomplete bladder emptying or incontinence.  He has continued taking Flomax daily as prescribed.  He had a follow-up visit with Jiles Crocker, NP on 06/23/2021 and  got another 40-month Eligard injection at that time.  PSA drawn prior to that visit on 06/16/2021 showing a favorable response at 1.18.  He was also prescribed oxybutynin but reports that he did not notice any significant improvement in the frequency/urgency/nocturia and did not tolerate the severe dry mouth so he is no longer taking this medication.  He continues with fatigue associated with the ADT but denies hot flashes and overall, is pleased with his progress to date.  ALLERGIES:  is allergic to niacin and related.  Meds: Current Outpatient Medications  Medication Sig Dispense Refill   atorvastatin (LIPITOR) 20 MG tablet TAKE 1 TABLET EVERY OTHER  DAY 45 tablet 1   Cholecalciferol (VITAMIN D-3) 1000 UNITS CAPS Take 2,000 Units by mouth daily.      diltiazem (CARDIZEM CD) 180 MG 24 hr capsule TAKE 1 CAPSULE DAILY 90 capsule 1   dofetilide (TIKOSYN) 250 MCG capsule Take 1 capsule (250 mcg total) by mouth every 12 (twelve) hours. 180 capsule 3   fish oil-omega-3 fatty acids 1000 MG capsule Take 1 g by mouth 2 (two) times daily.      losartan (COZAAR) 100 MG tablet Take 1 tablet (100 mg total) by mouth daily. 90 tablet 3   metoprolol tartrate (LOPRESSOR) 50 MG tablet TAKE ONE AND ONE-HALF      TABLETS TWICE A DAY 270 tablet 3   Multiple Vitamin (MULTIVITAMIN) tablet Take 1  tablet by mouth daily.     tamsulosin (FLOMAX) 0.4 MG CAPS capsule Take 2 capsules (0.8 mg total) by mouth daily after supper. 180 capsule 3   XARELTO 20 MG TABS tablet TAKE 1 TABLET DAILY WITH   SUPPER 90 tablet 0   No current facility-administered medications for this encounter.    Physical Findings:  vitals were not taken for this visit.  Pain Assessment Pain Score: 0-No pain/10 Unable to assess due to telephone follow-up visit format.  Lab Findings: Lab Results  Component Value Date   WBC 8.4 07/15/2018   HGB 16.1 07/15/2018   HCT 48.8 07/15/2018   MCV 89 07/15/2018   PLT 265 07/15/2018     Radiographic  Findings: No results found.  Impression/Plan: 1. 76 y.o. gentleman with Stage T1c adenocarcinoma of the prostate with Gleason score of 4+4, and PSA of 13.1.    He will continue to follow up with urology for ongoing PSA determinations and has an appointment scheduled for repeat labs on 09/22/2021 and will see Dr. Gloriann Loan the following week on 09/29/2021. He understands what to expect with regards to PSA monitoring going forward. I will look forward to following his response to treatment via correspondence with urology, and would be happy to continue to participate in his care if clinically indicated. I talked to the patient about what to expect in the future, including his risk for erectile dysfunction and rectal bleeding. I encouraged him to call or return to the office if he has any questions regarding his previous radiation or possible radiation side effects. He was comfortable with this plan and will follow up as needed.     Nicholos Johns, PA-C

## 2021-07-21 NOTE — Progress Notes (Signed)
°  Radiation Oncology         [phone]) [phone] ________________________________  Name: Stephen Francis MRN: [id number]  Date: 06/17/2021  DOB: 1946/03/16  End of Treatment Note  Diagnosis:   76 y.o. gentleman with Stage T1c adenocarcinoma of the prostate with Gleason score of 4+4, and PSA of 13.1.     Indication for treatment:  Curative, Definitive Radiotherapy       Radiation treatment dates:   04/14/21 - 06/17/21  Site/dose:  1. The prostate, seminal vesicles, and pelvic lymph nodes were initially treated to 45 Gy in 25 fractions of 1.8 Gy  2. The prostate only was boosted to 75 Gy with 15 additional fractions of 2.0 Gy   Beams/energy:  1. The prostate, seminal vesicles, and pelvic lymph nodes were initially treated using VMAT intensity modulated radiotherapy delivering 6 megavolt photons. Image guidance was performed with CB-CT studies prior to each fraction. He was immobilized with a body fix lower extremity mold.  2. the prostate only was boosted using VMAT intensity modulated radiotherapy delivering 6 megavolt photons. Image guidance was performed with CB-CT studies prior to each fraction. He was immobilized with a body fix lower extremity mold.  Narrative: The patient tolerated radiation treatment relatively well with only minor urinary irritation and modest fatigue.  He did report nocturia 4-6 times per night as well as increased frequency and urgency despite taking Flomax daily as prescribed.  He also experienced some mild diarrhea but did not require medical management.  Plan: The patient has completed radiation treatment. He will return to radiation oncology clinic for routine followup in one month. I advised him to call or return sooner if he has any questions or concerns related to his recovery or treatment. ________________________________  Sheral Apley. Tammi Klippel, M.D.

## 2021-08-02 ENCOUNTER — Other Ambulatory Visit: Payer: Self-pay | Admitting: Cardiology

## 2021-08-23 ENCOUNTER — Telehealth: Payer: Self-pay | Admitting: *Deleted

## 2021-09-02 ENCOUNTER — Other Ambulatory Visit: Payer: Self-pay | Admitting: Cardiology

## 2021-09-02 NOTE — Telephone Encounter (Signed)
Prescription refill request for Xarelto received.  ?Indication:Afib ?Last office visit:10/22 ?Weight:163.3 kg ?Age:76 ?Scr:0.8 ?CrCl:187.11  ml/min ? ?Prescription refilled ? ?

## 2021-09-06 ENCOUNTER — Inpatient Hospital Stay: Payer: Medicare Other | Admitting: *Deleted

## 2021-09-29 ENCOUNTER — Other Ambulatory Visit: Payer: Self-pay | Admitting: Cardiology

## 2021-10-06 ENCOUNTER — Inpatient Hospital Stay: Payer: Medicare Other | Attending: Adult Health | Admitting: *Deleted

## 2021-10-06 ENCOUNTER — Encounter: Payer: Self-pay | Admitting: *Deleted

## 2021-10-06 DIAGNOSIS — C61 Malignant neoplasm of prostate: Secondary | ICD-10-CM

## 2021-10-06 NOTE — Progress Notes (Signed)
?  2 Identifiers were used for verification purposes only. No vital signs was taken during this visit as this was a virtual visit. Pt denies pain today but does say he has some fatigue. Pt says he is able to do what he desires to without fatigue being an issue. Pt sleeps well although he gets up to bathroom 3-4 x at night. "Urine frequency mostly in daytime', he stated, but this has decreased since treatment. No burning with urination and pt says urine flow is normal. Bowels are back to normal, he had a few loose stools after radiation. Last PSA was 0.26 in April. Pt continues to take Eligard every 3 months. He does have some hot flashes and joint pain, he stated. He uses diclofenac gel for joint pain. Last colonoscopy reported was 2019. I discussed exercising and the benefits with patient. He does not exercise at all right now.  I also discussed the " Nutrition Rainbow" in helping him to make better food choices. Pt will see PCP next month. SCP reviewed and completed. ?

## 2021-10-22 ENCOUNTER — Other Ambulatory Visit: Payer: Self-pay | Admitting: Cardiology

## 2021-11-24 ENCOUNTER — Other Ambulatory Visit: Payer: Self-pay | Admitting: Cardiology

## 2022-01-29 ENCOUNTER — Other Ambulatory Visit: Payer: Self-pay | Admitting: Cardiology

## 2022-03-02 NOTE — Progress Notes (Signed)
Cardiology Office Note   Date:  03/03/2022   ID:  Stephen Francis, Stephen Francis 08-03-1945, MRN [id number]  PCP:  Cari Caraway, MD  Cardiologist:   Minus Breeding, MD    Chief Complaint  Patient presents with   Atrial Fibrillation       History of Present Illness: Stephen Francis is a 76 y.o. male who was seen by Dr. Wynonia Lawman  for follow atrial fib.  He was in the hospital in 2016 for Tikosyn for treatment of persistent atrial fib.  I saw him prior to radiation seed implants.  He has had several radiation therapies and his PSA is down.  He is on hormone therapy as below.  He has had no new cardiac complaints although he thinks he has some fibrillation occasionally.  He can check his heart rate.This does not persist.  He is not having any presyncope or syncope.  He gets around somewhat slowly but he can still climb a hill from his storage shed.  He is fatigued when he does this but this is not new.  Is not having any new chest pain.  He does have leg weakness and wonders if it could be the medications.  He has not had any chest pressure, neck or arm discomfort.   Past Medical History:  Diagnosis Date   Atrial fibrillation ([place])    Hyperlipidemia    Morbid obesity ([place])    Prostate cancer ([place])     Past Surgical History:  Procedure Laterality Date   CYST EXCISION     prostates biopsy     TONSILECTOMY, ADENOIDECTOMY, BILATERAL MYRINGOTOMY AND TUBES       Current Outpatient Medications  Medication Sig Dispense Refill   atorvastatin (LIPITOR) 20 MG tablet TAKE 1 TABLET EVERY OTHER  DAY 45 tablet 2   Cholecalciferol (VITAMIN D-3) 1000 UNITS CAPS Take 2,000 Units by mouth daily.      diltiazem (CARDIZEM CD) 180 MG 24 hr capsule TAKE 1 CAPSULE DAILY 90 capsule 2   dofetilide (TIKOSYN) 250 MCG capsule TAKE 1 CAPSULE BY MOUTH EVERY 12 HOURS 180 capsule 1   Leuprolide Acetate (ELIGARD) 7.5 MG injection Inject 7.5 mg into the skin. Every 90 days     losartan (COZAAR) 100 MG tablet  Take 1 tablet (100 mg total) by mouth daily. 90 tablet 3   metFORMIN (GLUCOPHAGE-XR) 500 MG 24 hr tablet Take 500 mg by mouth daily.     metoprolol tartrate (LOPRESSOR) 50 MG tablet TAKE 1 AND 1/2 TABLETS     TWICE DAILY 270 tablet 3   Multiple Vitamin (MULTIVITAMIN) tablet Take 1 tablet by mouth daily.     oxybutynin (DITROPAN-XL) 5 MG 24 hr tablet Take 5 mg by mouth daily.     rivaroxaban (XARELTO) 20 MG TABS tablet TAKE 1 TABLET DAILY WITH   SUPPER 90 tablet 1   tamsulosin (FLOMAX) 0.4 MG CAPS capsule Take 2 capsules (0.8 mg total) by mouth daily after supper. (Patient taking differently: Take 0.4 mg by mouth daily after supper.) 180 capsule 3   No current facility-administered medications for this visit.    Allergies:   Niacin and related    ROS:  Please see the history of present illness.   Otherwise, review of systems are positive for none.   All other systems are reviewed and negative.    PHYSICAL EXAM: VS:  BP 138/78   Pulse 77   Ht '6\' 2"'$  (1.88 m)   Wt (!) 359 lb  9.6 oz (163.1 kg)   SpO2 97%   BMI 46.17 kg/m  , BMI Body mass index is 46.17 kg/m. GENERAL:  Well appearing NECK:  No jugular venous distention, waveform within normal limits, carotid upstroke brisk and symmetric, no bruits, no thyromegaly LUNGS:  Clear to auscultation bilaterally CHEST:  Unremarkable HEART:  PMI not displaced or sustained,S1 and S2 within normal limits, no S3, no clicks, no rubs, no murmurs ABD:  Flat, positive bowel sounds normal in frequency in pitch, no bruits, no rebound, no guarding, no midline pulsatile mass, no hepatomegaly, no splenomegaly EXT:  2 plus pulses throughout, no edema, no cyanosis no clubbing   EKG:  EKG is  ordered today. The ekg ordered today demonstrates sinus rhythm, rate 77, nonspecific lateral T wave inversions, no acute ST-T wave changes.  Of note the QTc calculated to be 498 which has not changed from previous.  Recent Labs: No results found for requested labs  within last 365 days.    Lipid Panel    Component Value Date/Time   CHOL 205 (H) 10/28/2015 0844   TRIG 238 (H) 10/28/2015 0844   HDL 27 (L) 10/28/2015 0844   CHOLHDL 7.6 (H) 10/28/2015 0844   VLDL 48 (H) 10/28/2015 0844   LDLCALC 130 (H) 10/28/2015 0844      Wt Readings from Last 3 Encounters:  03/03/22 (!) 359 lb 9.6 oz (163.1 kg)  02/28/21 (!) 360 lb (163.3 kg)  02/23/20 (!) 370 lb (167.8 kg)      Other studies Reviewed: Additional studies/ records that were reviewed today include: None. Review of the above records demonstrates:  Please see elsewhere in the note.     ASSESSMENT AND PLAN:  ATRIAL FIB:   Stephen Francis has a CHA2DS2 - VASc score of 3.    He tolerates anticoagulation.  QTc is mildly prolonged but unchanged.  I will check a magnesium.   LEG WEAKNESS: I we will have him come off of Lipitor for about 6 weeks to see if this helps.  If he notices a definite correlation he will call me and we will consider another lipid-lowering drug.    MORBID OBESITY:   He understands he needs to lose weight.  We have discussed this.    DM: Hemoglobin A1c was 5.7 which is down from 6.3.  y   HYPERTENSIVE HEART DISEASE:   Blood pressure is upper limits of acceptable.  No change in therapy.    Current medicines are reviewed at length with the patient today.  The patient does not have concerns regarding medicines.  The following changes have been made: None  Labs/ tests ordered today include:   Orders Placed This Encounter  Procedures   Magnesium   EKG 12-Lead      Disposition:   FU with me in 12 months.     Signed, Minus Breeding, MD  03/03/2022 10:51 AM    [place]

## 2022-03-03 ENCOUNTER — Encounter: Payer: Self-pay | Admitting: Cardiology

## 2022-03-03 ENCOUNTER — Ambulatory Visit: Payer: Medicare Other | Attending: Cardiology | Admitting: Cardiology

## 2022-03-03 VITALS — BP 138/78 | HR 77 | Ht 74.0 in | Wt 359.6 lb

## 2022-03-03 DIAGNOSIS — I110 Hypertensive heart disease with heart failure: Secondary | ICD-10-CM

## 2022-03-03 DIAGNOSIS — I480 Paroxysmal atrial fibrillation: Secondary | ICD-10-CM

## 2022-03-03 DIAGNOSIS — E118 Type 2 diabetes mellitus with unspecified complications: Secondary | ICD-10-CM | POA: Diagnosis not present

## 2022-03-03 NOTE — Patient Instructions (Signed)
Medication Instructions:   STOP ATORVASTATIN FOR 6 WEEKS AND THEN LET us KNOW HOW YOU ARE FEELING  *If you need a refill on your cardiac medications before your next appointment, please call your pharmacy*  Follow-Up: At [facility], you and your health needs are our priority.  As part of our continuing mission to provide you with exceptional heart care, we have created designated Provider Care Teams.  These Care Teams include your primary Cardiologist (physician) and Advanced Practice Providers (APPs -  Physician Assistants and Nurse Practitioners) who all work together to provide you with the care you need, when you need it.  We recommend signing up for the patient portal called "MyChart".  Sign up information is provided on this After Visit Summary.  MyChart is used to connect with patients for Virtual Visits (Telemedicine).  Patients are able to view lab/test results, encounter notes, upcoming appointments, etc.  Non-urgent messages can be sent to your provider as well.   To learn more about what you can do with MyChart, go to [email or web address].    Your next appointment:   12 month(s)  The format for your next appointment:   In Person  Provider:   Minus Breeding, MD

## 2022-03-04 ENCOUNTER — Other Ambulatory Visit: Payer: Self-pay | Admitting: Cardiology

## 2022-03-04 LAB — MAGNESIUM: Magnesium: 2.3 mg/dL (ref 1.6–2.3)

## 2022-03-06 ENCOUNTER — Other Ambulatory Visit: Payer: Self-pay | Admitting: Cardiology

## 2022-03-06 NOTE — Telephone Encounter (Signed)
Prescription refill request for Xarelto received.  Indication:Afib Last office visit:10/23 Weight:163.1 kg Age:76 Scr:0.8 CrCl:186.89  ml/min  Prescription refilled

## 2022-03-09 ENCOUNTER — Encounter: Payer: Self-pay | Admitting: Cardiology

## 2022-03-09 MED ORDER — DOFETILIDE 250 MCG PO CAPS: 250.0000 ug | ORAL_CAPSULE | Freq: Two times a day (BID) | ORAL | 3 refills | Status: DC

## 2022-04-17 ENCOUNTER — Encounter: Payer: Self-pay | Admitting: Cardiology

## 2022-04-17 DIAGNOSIS — E785 Hyperlipidemia, unspecified: Secondary | ICD-10-CM

## 2022-04-17 DIAGNOSIS — Z79899 Other long term (current) drug therapy: Secondary | ICD-10-CM

## 2022-04-18 ENCOUNTER — Telehealth: Payer: Self-pay | Admitting: Cardiology

## 2022-04-18 ENCOUNTER — Other Ambulatory Visit: Payer: Self-pay

## 2022-04-18 DIAGNOSIS — Z79899 Other long term (current) drug therapy: Secondary | ICD-10-CM

## 2022-04-18 DIAGNOSIS — E785 Hyperlipidemia, unspecified: Secondary | ICD-10-CM

## 2022-04-18 MED ORDER — PRAVASTATIN SODIUM 80 MG PO TABS: 80.0000 mg | ORAL_TABLET | Freq: Every evening | ORAL | 11 refills | Status: DC

## 2022-04-18 MED ORDER — PRAVASTATIN SODIUM 80 MG PO TABS: 80.0000 mg | ORAL_TABLET | Freq: Every evening | ORAL | 3 refills | Status: DC

## 2022-04-18 NOTE — Telephone Encounter (Signed)
Spoke with pt regarding his pravastatin prescription that was sent in today for 30 days with 11 refills. Pt would like this changed to 90 day supply with 3 refills because the cost is the same. Changed prescription and spoke with [facility] to discontinue the 30 day prescription. This was successfully done and new prescription will be processed and mailed to pt. Will send a mychart message to pt to let him know that this has been completed.

## 2022-04-18 NOTE — Telephone Encounter (Signed)
  Pt c/o medication issue:  1. Name of Medication:   pravastatin (PRAVACHOL) 80 MG tablet    2. How are you currently taking this medication (dosage and times per day)?   Take 1 tablet (80 mg total) by mouth every evening.    3. Are you having a reaction (difficulty breathing--STAT)? No   4. What is your medication issue? Pt said, he needs 90 days supply sent to his pharmacy since it cost the same with the 30 days supply. Pt would like for prescription resend for 90 days supply

## 2022-05-30 ENCOUNTER — Other Ambulatory Visit: Payer: Self-pay | Admitting: Cardiology

## 2022-06-26 ENCOUNTER — Encounter: Payer: Self-pay | Admitting: Cardiology

## 2022-06-30 ENCOUNTER — Encounter: Payer: Self-pay | Admitting: Cardiology

## 2022-07-14 ENCOUNTER — Encounter: Payer: Self-pay | Admitting: *Deleted

## 2022-07-20 ENCOUNTER — Encounter: Payer: Self-pay | Admitting: Cardiology

## 2022-08-25 ENCOUNTER — Other Ambulatory Visit: Payer: Self-pay | Admitting: Cardiology

## 2022-08-25 NOTE — Telephone Encounter (Signed)
Prescription refill request for Xarelto received.  Indication: afib  Last office visit: [place], 03/03/2022 Weight: 163.1 kg  Age: 77 yo  Scr: Creatinine 0.810 06/20/2022 CrCl: 184 ml/min

## 2022-11-12 ENCOUNTER — Other Ambulatory Visit: Payer: Self-pay | Admitting: Cardiology

## 2022-12-21 ENCOUNTER — Other Ambulatory Visit (HOSPITAL_COMMUNITY): Payer: Self-pay

## 2023-01-02 ENCOUNTER — Other Ambulatory Visit: Payer: Self-pay | Admitting: Cardiology

## 2023-01-09 ENCOUNTER — Telehealth: Payer: Self-pay

## 2023-01-09 ENCOUNTER — Other Ambulatory Visit (HOSPITAL_COMMUNITY): Payer: Self-pay

## 2023-01-09 NOTE — Telephone Encounter (Signed)
Pharmacy Patient Advocate Encounter   Received notification from Fax that prior authorization for Dofetilide  is required/requested.   Insurance verification completed.   The patient is insured through [facility] .   Per test claim: PA required; PA submitted to [facility] via Phone Key/confirmation #/EOC ref# [id number]    Status is pending    Expected determination time is 24-72hrs Contact: [phone]

## 2023-01-10 NOTE — Telephone Encounter (Signed)
Pharmacy Patient Advocate Encounter  Received notification from [facility] that Prior Authorization for Dofetilide has been  Approved  from 8.13.24 to 8.13.25

## 2023-01-12 ENCOUNTER — Encounter: Payer: Self-pay | Admitting: Cardiology

## 2023-01-14 ENCOUNTER — Encounter: Payer: Self-pay | Admitting: Cardiology

## 2023-01-17 NOTE — Telephone Encounter (Signed)
I spoke with the patients wife, there is confirmation under the media tab that the dofetilide has been approved.

## 2023-01-31 ENCOUNTER — Other Ambulatory Visit: Payer: Self-pay | Admitting: Cardiology

## 2023-02-10 ENCOUNTER — Other Ambulatory Visit: Payer: Self-pay | Admitting: Cardiology

## 2023-02-12 NOTE — Telephone Encounter (Signed)
Prescription refill request for Xarelto received.  Indication:afib Last office visit:10/23 Weight:163.1  kg Age:77 Scr:0.81  1/24 CrCl:181.78  ml/min  Prescription refilled

## 2023-03-03 DIAGNOSIS — I4819 Other persistent atrial fibrillation: Secondary | ICD-10-CM | POA: Insufficient documentation

## 2023-03-03 NOTE — Progress Notes (Unsigned)
Cardiology Office Note:   Date:  03/05/2023  ID:  Stephen Francis, DOB 1945/08/17, MRN [id number] PCP: Stephen Dimitri, MD  [facility] Providers Cardiologist:  Stephen Rotunda, MD {  History of Present Illness:   Stephen Francis is a 77 y.o. male who was seen by Dr. Donnie Francis  for follow atrial fib.  He was in the hospital in 2016 for Tikosyn for treatment of persistent atrial fib.  I saw him prior to radiation seed implants.  He has had several radiation therapies and his PSA is down.  He is on hormone therapy as below.  Since I last saw him he has had no new cardiovascular complaints.  He never really felt his atrial fibrillation and does not think he is out of rhythm.  He tolerates anticoagulation without any problems.  He is completing prostate therapy and thinks he might be having some problems with this.  He has muscle aches and some back pain with standing and wonders if it could be his prostate therapy.  Previously he did come off of statins Crestor and Lipitor because he wonders if that could be causing the muscle aches and it might of helped.  Has been on pravastatin instead.  He gets some activity going down to his shed to get his yard equipment and he has a little workspace.  He gets some leg fatigue walking there but he is not having any chest pressure and does not have any new shortness of breath, PND or orthopnea.  ROS: As stated in the HPI and negative for all other systems.  Studies Reviewed:    EKG:   EKG Interpretation Date/Time:  Monday March 05 2023 11:03:42 EDT Ventricular Rate:  77 PR Interval:  226 QRS Duration:  80 QT Interval:  392 QTC Calculation: 443 R Axis:   5  Text Interpretation: Sinus rhythm with 1st degree A-V block Poor anterior R wave progression When compared with ECG of 09-Sep-2014 21:33, No significant change since last tracing Confirmed by Stephen Francis ([id number]) on 03/05/2023 11:09:53 AM     Risk Assessment/Calculations:    CHA2DS2-VASc  Score = 4   This indicates a 4.8% annual risk of stroke. The patient's score is based upon: CHF History: 0 HTN History: 1 Diabetes History: 1 Stroke History: 0 Vascular Disease History: 0 Age Score: 2 Gender Score: 0   Physical Exam:   VS:  BP 128/80 (BP Location: Left Arm, Patient Position: Sitting, Cuff Size: Normal)   Pulse 78   Ht 6' 1.5" (1.867 m)   Wt (!) 346 lb 9.6 oz (157.2 kg)   SpO2 98%   BMI 45.11 kg/m    Wt Readings from Last 3 Encounters:  03/05/23 (!) 346 lb 9.6 oz (157.2 kg)  03/03/22 (!) 359 lb 9.6 oz (163.1 kg)  02/28/21 (!) 360 lb (163.3 kg)     GEN: Well nourished, well developed in no acute distress NECK: No JVD; No carotid bruits CARDIAC: RRR, no murmurs, rubs, gallops RESPIRATORY:  Clear to auscultation without rales, wheezing or rhonchi  ABDOMEN: Soft, non-tender, non-distended EXTREMITIES:  No edema; No deformity   ASSESSMENT AND PLAN:   ATRIAL FIB:   Mr. Stephen Francis has a CHA2DS2 - VASc score of 3.   He tolerates anticoagulation and has had no symptomatic arrhythmias.  No change in therapy.  MORBID OBESITY:   We have previously talked about weight loss strategies.  He is now just started Ozempic and I encouraged continuing this.  DM: Hemoglobin A1c was 6.2.  Therapy per Stephen Dimitri, MD    HYPERTENSIVE HEART DISEASE:   Blood pressure is at target.  No change in therapy.  MUSCLE ACHE: He is going to wait until he stops his current injection although his prostate cancer and then a few months later if he still having muscle aches he is going to hold his pravastatin.  Ultimately he will let me know if he thinks it is the pravastatin it causes muscle aches and we would have to take something else for his lipids.       Follow up me in 12 months.   Signed, Stephen Rotunda, MD

## 2023-03-05 ENCOUNTER — Ambulatory Visit: Payer: Medicare Other | Attending: Cardiology | Admitting: Cardiology

## 2023-03-05 ENCOUNTER — Encounter: Payer: Self-pay | Admitting: Cardiology

## 2023-03-05 VITALS — BP 128/80 | HR 78 | Ht 73.5 in | Wt 346.6 lb

## 2023-03-05 DIAGNOSIS — I4819 Other persistent atrial fibrillation: Secondary | ICD-10-CM

## 2023-03-05 DIAGNOSIS — I110 Hypertensive heart disease with heart failure: Secondary | ICD-10-CM | POA: Diagnosis not present

## 2023-03-05 DIAGNOSIS — E118 Type 2 diabetes mellitus with unspecified complications: Secondary | ICD-10-CM

## 2023-03-05 NOTE — Patient Instructions (Signed)

## 2023-03-31 ENCOUNTER — Other Ambulatory Visit: Payer: Self-pay | Admitting: Cardiology

## 2023-04-25 ENCOUNTER — Other Ambulatory Visit: Payer: Self-pay | Admitting: Cardiology

## 2023-05-20 IMAGING — NM NM BONE WHOLE BODY
2 series · 2 of 2 positions shown · non-contrast
Comparison: None.

CLINICAL DATA: Prostate cancer. PSA is 13.10. No bone pain. No
prior surgery, fracture, or recent injury.

EXAM:
NUCLEAR MEDICINE WHOLE BODY BONE SCAN
TECHNIQUE: Whole body anterior and posterior images were obtained approximately
3 hours after intravenous injection of radiopharmaceutical.
RADIOPHARMACEUTICALS:  Twenty-two mCi 3echnetium-UUm MDP IV

[Series 1: wbr_bone_40 whole body · 2.66mm/px · 1 of 1 slices shown (1 of 2)]
[im 1/1]
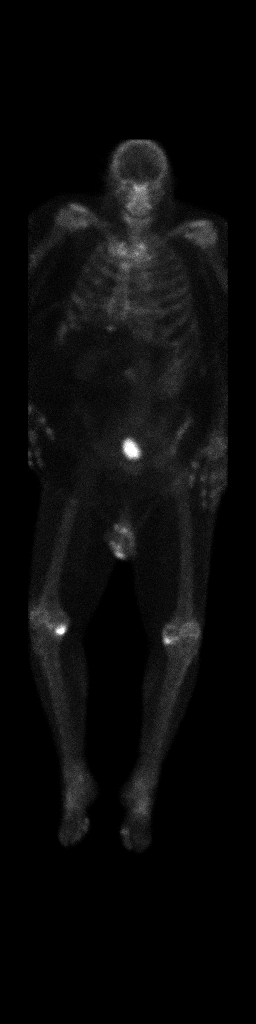

[Series 1: wbr_bone_40 whole body · 2.66mm/px · 1 of 1 slices shown (2 of 2)]
[im 1/1]
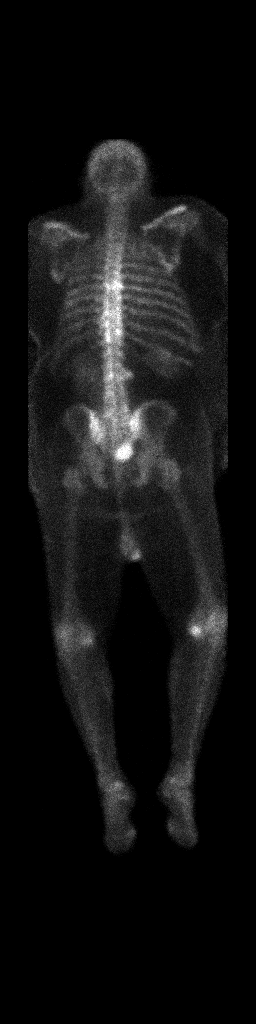

[2 of 2 positions shown; findings below may reference images not displayed]

FINDINGS: Bilateral renal activity and bladder activity are present. There are
focal areas of increased activity in the MEDIAL compartments of both
knees, likely degenerative. Focal areas of increased activity also
noted in the mid and LOWER thoracic spine and mid lumbar spine, also
likely degenerative.
IMPRESSION: No evidence for osseous metastatic disease.

## 2023-05-31 ENCOUNTER — Encounter: Payer: Self-pay | Admitting: Cardiology

## 2023-05-31 MED ORDER — APIXABAN 5 MG PO TABS: 5.0000 mg | ORAL_TABLET | Freq: Two times a day (BID) | ORAL | 1 refills | Status: DC

## 2023-07-17 ENCOUNTER — Other Ambulatory Visit: Payer: Self-pay | Admitting: Cardiology

## 2023-08-28 ENCOUNTER — Other Ambulatory Visit: Payer: Self-pay | Admitting: Cardiology

## 2023-10-31 ENCOUNTER — Other Ambulatory Visit: Payer: Self-pay

## 2023-10-31 MED ORDER — METOPROLOL TARTRATE 50 MG PO TABS: 75.0000 mg | ORAL_TABLET | Freq: Two times a day (BID) | ORAL | 1 refills | Status: DC

## 2023-12-10 ENCOUNTER — Telehealth: Payer: Self-pay

## 2023-12-10 ENCOUNTER — Other Ambulatory Visit (HOSPITAL_COMMUNITY): Payer: Self-pay

## 2023-12-10 NOTE — Telephone Encounter (Signed)
*  STAT* If patient is at the pharmacy, call can be transferred to refill team.   1. Which medications need to be refilled? (please list name of each medication and dose if known) Dofetilide    2. Would you like to learn more about the convenience, safety, & potential cost savings by using the [facility]?     3. Are you open to using the [facility] (Type [facility]).   4. Which pharmacy/location (including street and city if local pharmacy) is medication to be sent to?[facility]  5. Do they need a 30 day or 90 day supply? Prescription was written for the wrong amount. It should be 90 days # 180 and refills

## 2023-12-10 NOTE — Telephone Encounter (Signed)
 Pharmacy Patient Advocate Encounter   Received notification from Fax that prior authorization for DOFETILIDE  is required/requested.   Insurance verification completed.   The patient is insured through [facility] .   Per test claim: PA required; PA submitted to above mentioned insurance via Fax Key/confirmation #/[id number] Status is pending

## 2023-12-13 ENCOUNTER — Other Ambulatory Visit (HOSPITAL_COMMUNITY): Payer: Self-pay

## 2023-12-13 NOTE — Telephone Encounter (Signed)
 Pharmacy Patient Advocate Encounter  Received notification from [facility] that Prior Authorization for DOFETILIDE  has been APPROVED from 12/10/23 to 12/09/24   Filled on 10/29/23

## 2024-01-23 ENCOUNTER — Other Ambulatory Visit: Payer: Self-pay | Admitting: Cardiology

## 2024-01-23 NOTE — Telephone Encounter (Signed)
 Prescription refill request for Eliquis  received. Indication:afib Last office visit:10/24 Scr:0.91 2025 Age: 78 Weight:157.2  kg  Prescription refilled

## 2024-01-26 ENCOUNTER — Encounter: Payer: Self-pay | Admitting: Cardiology

## 2024-01-26 DIAGNOSIS — I480 Paroxysmal atrial fibrillation: Secondary | ICD-10-CM

## 2024-01-29 MED ORDER — RIVAROXABAN 20 MG PO TABS: 20.0000 mg | ORAL_TABLET | Freq: Every day | ORAL | 1 refills | Status: DC

## 2024-01-29 NOTE — Addendum Note (Signed)
 Addended by: Lux Meaders D on: 01/29/2024 04:48 PM   Modules accepted: Orders

## 2024-03-04 DIAGNOSIS — I10 Essential (primary) hypertension: Secondary | ICD-10-CM | POA: Insufficient documentation

## 2024-03-04 NOTE — Progress Notes (Unsigned)
 Cardiology Office Note:   Date:  03/06/2024  ID:  Stephen Francis, DOB April 24, 1946, MRN [id number] PCP: Aisha Harvey, MD  [facility] Providers Cardiologist:  Lynwood Schilling, MD {  History of Present Illness:   Stephen Francis is a 78 y.o. male who was seen by Dr. Blanca  for follow atrial fib.  He was in the hospital in 2016 for Tikosyn  for treatment of persistent atrial fib.  I saw him prior to radiation seed implants.  He has had several radiation therapies and his PSA is down.  He is on hormone therapy.   Since I last saw him he has had no new cardiovascular complaints.  He feels fatigued.  He does not notice that he is back in atrial fibrillation.  He does not feel palpitations.  He has not had any presyncope or syncope.  He denies any chest pressure, neck or arm discomfort.  He has had problems taking his empagliflozin and he actually stopped taking it in part because of cost.  It also he did not think really made a big difference with his A1c and he did not have weight loss.  Previously we stopped his pravastatin  to see if it helped with some leg tiredness that he was having but it did not.  He is going to go back to taking that every day.  ROS: As stated in the HPI and negative for all other systems.  Studies Reviewed:    EKG:   EKG Interpretation Date/Time:  Thursday March 06 2024 09:07:04 EDT Ventricular Rate:  100 PR Interval:    QRS Duration:  74 QT Interval:  372 QTC Calculation: 479 R Axis:   -28  Text Interpretation: Atrial fibrillation Low voltage QRS Possible Inferior infarct (cited on or before 06-Mar-2024) Cannot rule out Anterior infarct (cited on or before 06-Mar-2024) When compared with ECG of 05-Mar-2023 11:03, voltage is lower than previous Confirmed by Schilling Lynwood ([id number]) on 03/06/2024 9:32:58 AM    Risk Assessment/Calculations:    CHA2DS2-VASc Score = 4   This indicates a 4.8% annual risk of stroke. The patient's score is based upon: CHF  History: 0 HTN History: 1 Diabetes History: 1 Stroke History: 0 Vascular Disease History: 0 Age Score: 2 Gender Score: 0   Physical Exam:   VS:  BP 101/66 (BP Location: Left Arm, Patient Position: Sitting, Cuff Size: Large)   Pulse 70   Ht 6' 2 (1.88 m)   Wt (!) 343 lb (155.6 kg)   SpO2 (!) 70%   BMI 44.04 kg/m    Wt Readings from Last 3 Encounters:  03/06/24 (!) 343 lb (155.6 kg)  03/05/23 (!) 346 lb 9.6 oz (157.2 kg)  03/03/22 (!) 359 lb 9.6 oz (163.1 kg)     GEN: Well nourished, well developed in no acute distress NECK: No JVD; No carotid bruits CARDIAC: Irregular RR, no murmurs, rubs, gallops RESPIRATORY:  Clear to auscultation without rales, wheezing or rhonchi  ABDOMEN: Soft, non-tender, non-distended EXTREMITIES:  No edema; No deformity   ASSESSMENT AND PLAN:   ATRIAL FIB:   Mr. [name] has a CHA2DS2 - VASc score of 3.  At this point he is in fibrillation I do not really think he is feeling this.  I do not think there is an advantage to further rhythm control.  He tolerates anticoagulation.  I am going to put a 3-day ZIO monitor if he is persistently in fibrillation I will discontinue his Tikosyn .   MORBID OBESITY:  We had long conversations about this.  I have asked him to talk to his primary provider about the GLP-1 RA   DM: Hemoglobin A1c was 5.9.  Plans per Dr. Lenora.   HYPERTENSIVE HEART DISEASE:   Blood pressure is at target.  No change in therapy.   Follow up with me in 1 year.  Signed, Lynwood Schilling, MD

## 2024-03-06 ENCOUNTER — Ambulatory Visit: Attending: Cardiology | Admitting: Cardiology

## 2024-03-06 ENCOUNTER — Encounter: Payer: Self-pay | Admitting: Cardiology

## 2024-03-06 ENCOUNTER — Ambulatory Visit

## 2024-03-06 VITALS — BP 101/66 | HR 70 | Ht 74.0 in | Wt 343.0 lb

## 2024-03-06 DIAGNOSIS — E118 Type 2 diabetes mellitus with unspecified complications: Secondary | ICD-10-CM | POA: Diagnosis not present

## 2024-03-06 DIAGNOSIS — I480 Paroxysmal atrial fibrillation: Secondary | ICD-10-CM | POA: Diagnosis not present

## 2024-03-06 DIAGNOSIS — I10 Essential (primary) hypertension: Secondary | ICD-10-CM

## 2024-03-06 NOTE — Progress Notes (Unsigned)
 Enrolled patient for a 3 day Zio XT monitor to be mailed to patients home

## 2024-03-06 NOTE — Patient Instructions (Signed)
 Medication Instructions:  Your physician recommends that you continue on your current medications as directed. Please refer to the Current Medication list given to you today.  *If you need a refill on your cardiac medications before your next appointment, please call your pharmacy*  Lab Work: NONE If you have labs (blood work) drawn today and your tests are completely normal, you will receive your results only by: MyChart Message (if you have MyChart) OR A paper copy in the mail If you have any lab test that is abnormal or we need to change your treatment, we will call you to review the results.  Testing/Procedures: 3 Day Zio Heart Monitor Your physician has requested that you wear a Zio heart monitor for __3___ days. This will be mailed to your home with instructions on how to apply the monitor and how to return it when finished. Please allow 2 weeks after returning the heart monitor before our office calls you with the results.   Follow-Up: At [facility], you and your health needs are our priority.  As part of our continuing mission to provide you with exceptional heart care, our providers are all part of one team.  This team includes your primary Cardiologist (physician) and Advanced Practice Providers or APPs (Physician Assistants and Nurse Practitioners) who all work together to provide you with the care you need, when you need it.  Your next appointment:   1 year(s)  Provider:   Lynwood Schilling, MD    We recommend signing up for the patient portal called MyChart.  Sign up information is provided on this After Visit Summary.  MyChart is used to connect with patients for Virtual Visits (Telemedicine).  Patients are able to view lab/test results, encounter notes, upcoming appointments, etc.  Non-urgent messages can be sent to your provider as well.   To learn more about what you can do with MyChart, go to [email or web address].   Other Instructions ZIO XT- Long Term  Monitor Instructions  Your physician has requested you wear a ZIO patch monitor for 3 days.  This is a single patch monitor. [employer] supplies one patch monitor per enrollment. Additional stickers are not available. Please do not apply patch if you will be having a Nuclear Stress Test,  Echocardiogram, Cardiac CT, MRI, or Chest Xray during the period you would be wearing the  monitor. The patch cannot be worn during these tests. You cannot remove and re-apply the  ZIO XT patch monitor.  Your ZIO patch monitor will be mailed 3 day USPS to your address on file. It may take 3-5 days  to receive your monitor after you have been enrolled.  Once you have received your monitor, please review the enclosed instructions. Your monitor  has already been registered assigning a specific monitor serial # to you.  Billing and Patient Assistance Program Information  We have supplied [employer] with any of your insurance information on file for billing purposes. [employer] offers a sliding scale Patient Assistance Program for patients that do not have  insurance, or whose insurance does not completely cover the cost of the ZIO monitor.  You must apply for the Patient Assistance Program to qualify for this discounted rate.  To apply, please call [employer] at [phone], select option 4, select option 2, ask to apply for  Patient Assistance Program. Meredeth will ask your household income, and how many people  are in your household. They will quote your out-of-pocket cost based on that information.  [employer] will also be  able to set up a 44-month, interest-free payment plan if needed.  Applying the monitor   Shave hair from upper left chest.  Hold abrader disc by orange tab. Rub abrader in 40 strokes over the upper left chest as  indicated in your monitor instructions.  Clean area with 4 enclosed alcohol pads. Let dry.  Apply patch as indicated in monitor instructions. Patch will be placed under collarbone on left   side of chest with arrow pointing upward.  Rub patch adhesive wings for 2 minutes. Remove white label marked 1. Remove the white  label marked 2. Rub patch adhesive wings for 2 additional minutes.  While looking in a mirror, press and release button in center of patch. A small green light will  flash 3-4 times. This will be your only indicator that the monitor has been turned on.  Do not shower for the first 24 hours. You may shower after the first 24 hours.  Press the button if you feel a symptom. You will hear a small click. Record Date, Time and  Symptom in the Patient Logbook.  When you are ready to remove the patch, follow instructions on the last 2 pages of Patient  Logbook. Stick patch monitor onto the last page of Patient Logbook.  Place Patient Logbook in the blue and white box. Use locking tab on box and tape box closed  securely. The blue and white box has prepaid postage on it. Please place it in the mailbox as  soon as possible. Your physician should have your test results approximately 7 days after the  monitor has been mailed back to [facility].  Call [facility] Customer Care at [phone] if you have questions regarding  your ZIO XT patch monitor. Call them immediately if you see an orange light blinking on your  monitor.  If your monitor falls off in less than 4 days, contact our Monitor department at [phone].  If your monitor becomes loose or falls off after 4 days call [employer] at [phone] for  suggestions on securing your monitor

## 2024-03-18 ENCOUNTER — Other Ambulatory Visit: Payer: Self-pay | Admitting: Cardiology

## 2024-03-26 DIAGNOSIS — I480 Paroxysmal atrial fibrillation: Secondary | ICD-10-CM | POA: Diagnosis not present

## 2024-03-30 ENCOUNTER — Ambulatory Visit: Payer: Self-pay | Admitting: Cardiology

## 2024-04-07 ENCOUNTER — Other Ambulatory Visit: Payer: Self-pay | Admitting: Cardiology

## 2024-04-07 DIAGNOSIS — I480 Paroxysmal atrial fibrillation: Secondary | ICD-10-CM

## 2024-04-07 NOTE — Telephone Encounter (Signed)
 Xarelto  20mg  refill request received. Pt is 78 years old, weight-155.6kg, Crea-0.86 on 01/22/24 via [facility] from [place], last seen by Dr. Lavona on 03/06/24, Diagnosis-Afib, CrCl-158.31 mL/min; Dose is appropriate based on dosing criteria. Will send in refill to requested pharmacy.

## 2024-04-14 ENCOUNTER — Other Ambulatory Visit: Payer: Self-pay | Admitting: Cardiology

## 2024-04-24 ENCOUNTER — Other Ambulatory Visit: Payer: Self-pay | Admitting: Cardiology

## 2024-05-12 ENCOUNTER — Encounter: Payer: Self-pay | Admitting: Cardiology

## 2024-05-14 NOTE — Telephone Encounter (Signed)
 Pt notified of Dr. Denver comments and suggestions. Last read by Zachary JONELLE Shed at 11:37AM on 05/14/2024.

## 2024-05-17 ENCOUNTER — Other Ambulatory Visit: Payer: Self-pay | Admitting: Cardiology
